# Patient Record
Sex: Male | Born: 1970 | Race: Black or African American | Hispanic: No | Smoking: Current every day smoker
Health system: Southern US, Community
[De-identification: ages and names within clinical notes are randomized; demographics above are authoritative.]

## PROBLEM LIST (undated history)

## (undated) DIAGNOSIS — S83249A Other tear of medial meniscus, current injury, unspecified knee, initial encounter: Secondary | ICD-10-CM

## (undated) DIAGNOSIS — Z720 Tobacco use: Secondary | ICD-10-CM

## (undated) DIAGNOSIS — F1910 Other psychoactive substance abuse, uncomplicated: Secondary | ICD-10-CM

## (undated) DIAGNOSIS — N390 Urinary tract infection, site not specified: Secondary | ICD-10-CM

## (undated) DIAGNOSIS — F17200 Nicotine dependence, unspecified, uncomplicated: Secondary | ICD-10-CM

## (undated) DIAGNOSIS — I10 Essential (primary) hypertension: Secondary | ICD-10-CM

## (undated) HISTORY — PX: BACK SURGERY: SHX140

---

## 1998-12-26 ENCOUNTER — Observation Stay (HOSPITAL_COMMUNITY): Admission: RE | Admit: 1998-12-26 | Discharge: 1998-12-26 | Payer: Self-pay | Admitting: Neurosurgery

## 1998-12-26 ENCOUNTER — Encounter: Payer: Self-pay | Admitting: Neurosurgery

## 2000-03-10 ENCOUNTER — Emergency Department (HOSPITAL_COMMUNITY): Admission: EM | Admit: 2000-03-10 | Discharge: 2000-03-10 | Payer: Self-pay | Admitting: Emergency Medicine

## 2001-02-16 ENCOUNTER — Encounter: Admission: RE | Admit: 2001-02-16 | Discharge: 2001-02-16 | Payer: Self-pay | Admitting: Internal Medicine

## 2001-02-16 ENCOUNTER — Encounter: Payer: Self-pay | Admitting: Internal Medicine

## 2002-09-16 ENCOUNTER — Encounter: Payer: Self-pay | Admitting: Emergency Medicine

## 2002-09-17 ENCOUNTER — Inpatient Hospital Stay (HOSPITAL_COMMUNITY): Admission: EM | Admit: 2002-09-17 | Discharge: 2002-09-18 | Payer: Self-pay | Admitting: Emergency Medicine

## 2002-09-17 ENCOUNTER — Encounter: Payer: Self-pay | Admitting: Internal Medicine

## 2002-09-17 ENCOUNTER — Encounter: Payer: Self-pay | Admitting: Cardiology

## 2002-10-14 ENCOUNTER — Emergency Department (HOSPITAL_COMMUNITY): Admission: EM | Admit: 2002-10-14 | Discharge: 2002-10-14 | Payer: Self-pay | Admitting: Emergency Medicine

## 2002-10-14 ENCOUNTER — Encounter: Payer: Self-pay | Admitting: Emergency Medicine

## 2002-10-15 ENCOUNTER — Emergency Department (HOSPITAL_COMMUNITY): Admission: EM | Admit: 2002-10-15 | Discharge: 2002-10-15 | Payer: Self-pay | Admitting: Emergency Medicine

## 2003-04-04 ENCOUNTER — Encounter: Payer: Self-pay | Admitting: Emergency Medicine

## 2003-04-04 ENCOUNTER — Emergency Department (HOSPITAL_COMMUNITY): Admission: EM | Admit: 2003-04-04 | Discharge: 2003-04-04 | Payer: Self-pay | Admitting: *Deleted

## 2003-11-11 ENCOUNTER — Emergency Department (HOSPITAL_COMMUNITY): Admission: EM | Admit: 2003-11-11 | Discharge: 2003-11-11 | Payer: Self-pay | Admitting: Emergency Medicine

## 2004-02-02 ENCOUNTER — Emergency Department (HOSPITAL_COMMUNITY): Admission: EM | Admit: 2004-02-02 | Discharge: 2004-02-02 | Payer: Self-pay | Admitting: Family Medicine

## 2004-11-14 ENCOUNTER — Emergency Department (HOSPITAL_COMMUNITY): Admission: EM | Admit: 2004-11-14 | Discharge: 2004-11-14 | Payer: Self-pay | Admitting: Family Medicine

## 2004-11-16 ENCOUNTER — Emergency Department (HOSPITAL_COMMUNITY): Admission: EM | Admit: 2004-11-16 | Discharge: 2004-11-16 | Payer: Self-pay | Admitting: Emergency Medicine

## 2005-01-26 ENCOUNTER — Emergency Department (HOSPITAL_COMMUNITY): Admission: EM | Admit: 2005-01-26 | Discharge: 2005-01-27 | Payer: Self-pay | Admitting: Emergency Medicine

## 2005-04-24 ENCOUNTER — Emergency Department (HOSPITAL_COMMUNITY): Admission: EM | Admit: 2005-04-24 | Discharge: 2005-04-24 | Payer: Self-pay | Admitting: Emergency Medicine

## 2005-05-11 ENCOUNTER — Emergency Department (HOSPITAL_COMMUNITY): Admission: EM | Admit: 2005-05-11 | Discharge: 2005-05-11 | Payer: Self-pay | Admitting: Emergency Medicine

## 2005-09-16 ENCOUNTER — Emergency Department (HOSPITAL_COMMUNITY): Admission: EM | Admit: 2005-09-16 | Discharge: 2005-09-17 | Payer: Self-pay | Admitting: Emergency Medicine

## 2005-09-23 ENCOUNTER — Emergency Department (HOSPITAL_COMMUNITY): Admission: EM | Admit: 2005-09-23 | Discharge: 2005-09-23 | Payer: Self-pay | Admitting: Emergency Medicine

## 2005-09-28 ENCOUNTER — Encounter: Admission: RE | Admit: 2005-09-28 | Discharge: 2005-09-28 | Payer: Self-pay | Admitting: Neurosurgery

## 2005-10-26 ENCOUNTER — Emergency Department (HOSPITAL_COMMUNITY): Admission: EM | Admit: 2005-10-26 | Discharge: 2005-10-26 | Payer: Self-pay | Admitting: Emergency Medicine

## 2005-11-24 ENCOUNTER — Emergency Department (HOSPITAL_COMMUNITY): Admission: EM | Admit: 2005-11-24 | Discharge: 2005-11-24 | Payer: Self-pay | Admitting: Emergency Medicine

## 2006-01-19 ENCOUNTER — Emergency Department (HOSPITAL_COMMUNITY): Admission: EM | Admit: 2006-01-19 | Discharge: 2006-01-19 | Payer: Self-pay | Admitting: Emergency Medicine

## 2006-02-04 ENCOUNTER — Emergency Department (HOSPITAL_COMMUNITY): Admission: EM | Admit: 2006-02-04 | Discharge: 2006-02-04 | Payer: Self-pay | Admitting: Family Medicine

## 2006-03-05 ENCOUNTER — Emergency Department (HOSPITAL_COMMUNITY): Admission: EM | Admit: 2006-03-05 | Discharge: 2006-03-05 | Payer: Self-pay | Admitting: Emergency Medicine

## 2006-03-31 ENCOUNTER — Emergency Department (HOSPITAL_COMMUNITY): Admission: EM | Admit: 2006-03-31 | Discharge: 2006-03-31 | Payer: Self-pay | Admitting: Emergency Medicine

## 2006-05-11 ENCOUNTER — Emergency Department (HOSPITAL_COMMUNITY): Admission: EM | Admit: 2006-05-11 | Discharge: 2006-05-11 | Payer: Self-pay | Admitting: Emergency Medicine

## 2006-06-08 ENCOUNTER — Emergency Department (HOSPITAL_COMMUNITY): Admission: EM | Admit: 2006-06-08 | Discharge: 2006-06-08 | Payer: Self-pay | Admitting: Emergency Medicine

## 2006-07-06 ENCOUNTER — Emergency Department (HOSPITAL_COMMUNITY): Admission: EM | Admit: 2006-07-06 | Discharge: 2006-07-06 | Payer: Self-pay | Admitting: Emergency Medicine

## 2006-08-11 ENCOUNTER — Emergency Department (HOSPITAL_COMMUNITY): Admission: EM | Admit: 2006-08-11 | Discharge: 2006-08-11 | Payer: Self-pay | Admitting: Emergency Medicine

## 2006-08-23 ENCOUNTER — Emergency Department (HOSPITAL_COMMUNITY): Admission: EM | Admit: 2006-08-23 | Discharge: 2006-08-24 | Payer: Self-pay | Admitting: Emergency Medicine

## 2006-09-05 ENCOUNTER — Encounter: Admission: RE | Admit: 2006-09-05 | Discharge: 2006-09-05 | Payer: Self-pay | Admitting: Neurosurgery

## 2006-11-02 ENCOUNTER — Emergency Department (HOSPITAL_COMMUNITY): Admission: EM | Admit: 2006-11-02 | Discharge: 2006-11-02 | Payer: Self-pay | Admitting: Emergency Medicine

## 2006-11-09 ENCOUNTER — Emergency Department (HOSPITAL_COMMUNITY): Admission: EM | Admit: 2006-11-09 | Discharge: 2006-11-09 | Payer: Self-pay | Admitting: Emergency Medicine

## 2006-12-07 ENCOUNTER — Emergency Department (HOSPITAL_COMMUNITY): Admission: EM | Admit: 2006-12-07 | Discharge: 2006-12-07 | Payer: Self-pay | Admitting: *Deleted

## 2006-12-21 ENCOUNTER — Emergency Department (HOSPITAL_COMMUNITY): Admission: EM | Admit: 2006-12-21 | Discharge: 2006-12-21 | Payer: Self-pay | Admitting: Emergency Medicine

## 2007-01-08 ENCOUNTER — Emergency Department (HOSPITAL_COMMUNITY): Admission: EM | Admit: 2007-01-08 | Discharge: 2007-01-09 | Payer: Self-pay | Admitting: Emergency Medicine

## 2007-01-18 ENCOUNTER — Emergency Department (HOSPITAL_COMMUNITY): Admission: EM | Admit: 2007-01-18 | Discharge: 2007-01-18 | Payer: Self-pay | Admitting: Emergency Medicine

## 2007-01-20 ENCOUNTER — Inpatient Hospital Stay (HOSPITAL_COMMUNITY): Admission: RE | Admit: 2007-01-20 | Discharge: 2007-01-22 | Payer: Self-pay | Admitting: Neurosurgery

## 2007-02-26 ENCOUNTER — Encounter: Admission: RE | Admit: 2007-02-26 | Discharge: 2007-02-26 | Payer: Self-pay | Admitting: Neurosurgery

## 2007-05-03 ENCOUNTER — Emergency Department (HOSPITAL_COMMUNITY): Admission: EM | Admit: 2007-05-03 | Discharge: 2007-05-03 | Payer: Self-pay | Admitting: Emergency Medicine

## 2007-05-10 ENCOUNTER — Emergency Department (HOSPITAL_COMMUNITY): Admission: EM | Admit: 2007-05-10 | Discharge: 2007-05-10 | Payer: Self-pay | Admitting: Emergency Medicine

## 2007-05-18 ENCOUNTER — Emergency Department (HOSPITAL_COMMUNITY): Admission: EM | Admit: 2007-05-18 | Discharge: 2007-05-19 | Payer: Self-pay | Admitting: Emergency Medicine

## 2007-06-07 ENCOUNTER — Emergency Department (HOSPITAL_COMMUNITY): Admission: EM | Admit: 2007-06-07 | Discharge: 2007-06-07 | Payer: Self-pay | Admitting: Emergency Medicine

## 2007-06-11 ENCOUNTER — Emergency Department (HOSPITAL_COMMUNITY): Admission: EM | Admit: 2007-06-11 | Discharge: 2007-06-11 | Payer: Self-pay | Admitting: Emergency Medicine

## 2007-06-13 ENCOUNTER — Encounter: Admission: RE | Admit: 2007-06-13 | Discharge: 2007-06-13 | Payer: Self-pay | Admitting: Neurosurgery

## 2007-06-14 ENCOUNTER — Emergency Department (HOSPITAL_COMMUNITY): Admission: EM | Admit: 2007-06-14 | Discharge: 2007-06-14 | Payer: Self-pay | Admitting: Emergency Medicine

## 2007-07-07 ENCOUNTER — Emergency Department (HOSPITAL_COMMUNITY): Admission: EM | Admit: 2007-07-07 | Discharge: 2007-07-07 | Payer: Self-pay | Admitting: *Deleted

## 2007-09-06 ENCOUNTER — Emergency Department (HOSPITAL_COMMUNITY): Admission: EM | Admit: 2007-09-06 | Discharge: 2007-09-06 | Payer: Self-pay | Admitting: *Deleted

## 2007-10-17 ENCOUNTER — Emergency Department (HOSPITAL_COMMUNITY): Admission: EM | Admit: 2007-10-17 | Discharge: 2007-10-17 | Payer: Self-pay | Admitting: Emergency Medicine

## 2008-02-22 IMAGING — CR DG CHEST 2V
3 series · 3 of 3 positions shown · non-contrast
Comparison: Report dated 10/08/2002

CLINICAL DATA: Spinal stenosis. History of tobacco use. Preoperative for lumbar
surgery.

CHEST - 2 VIEW

[view not recorded (1 of 3)]
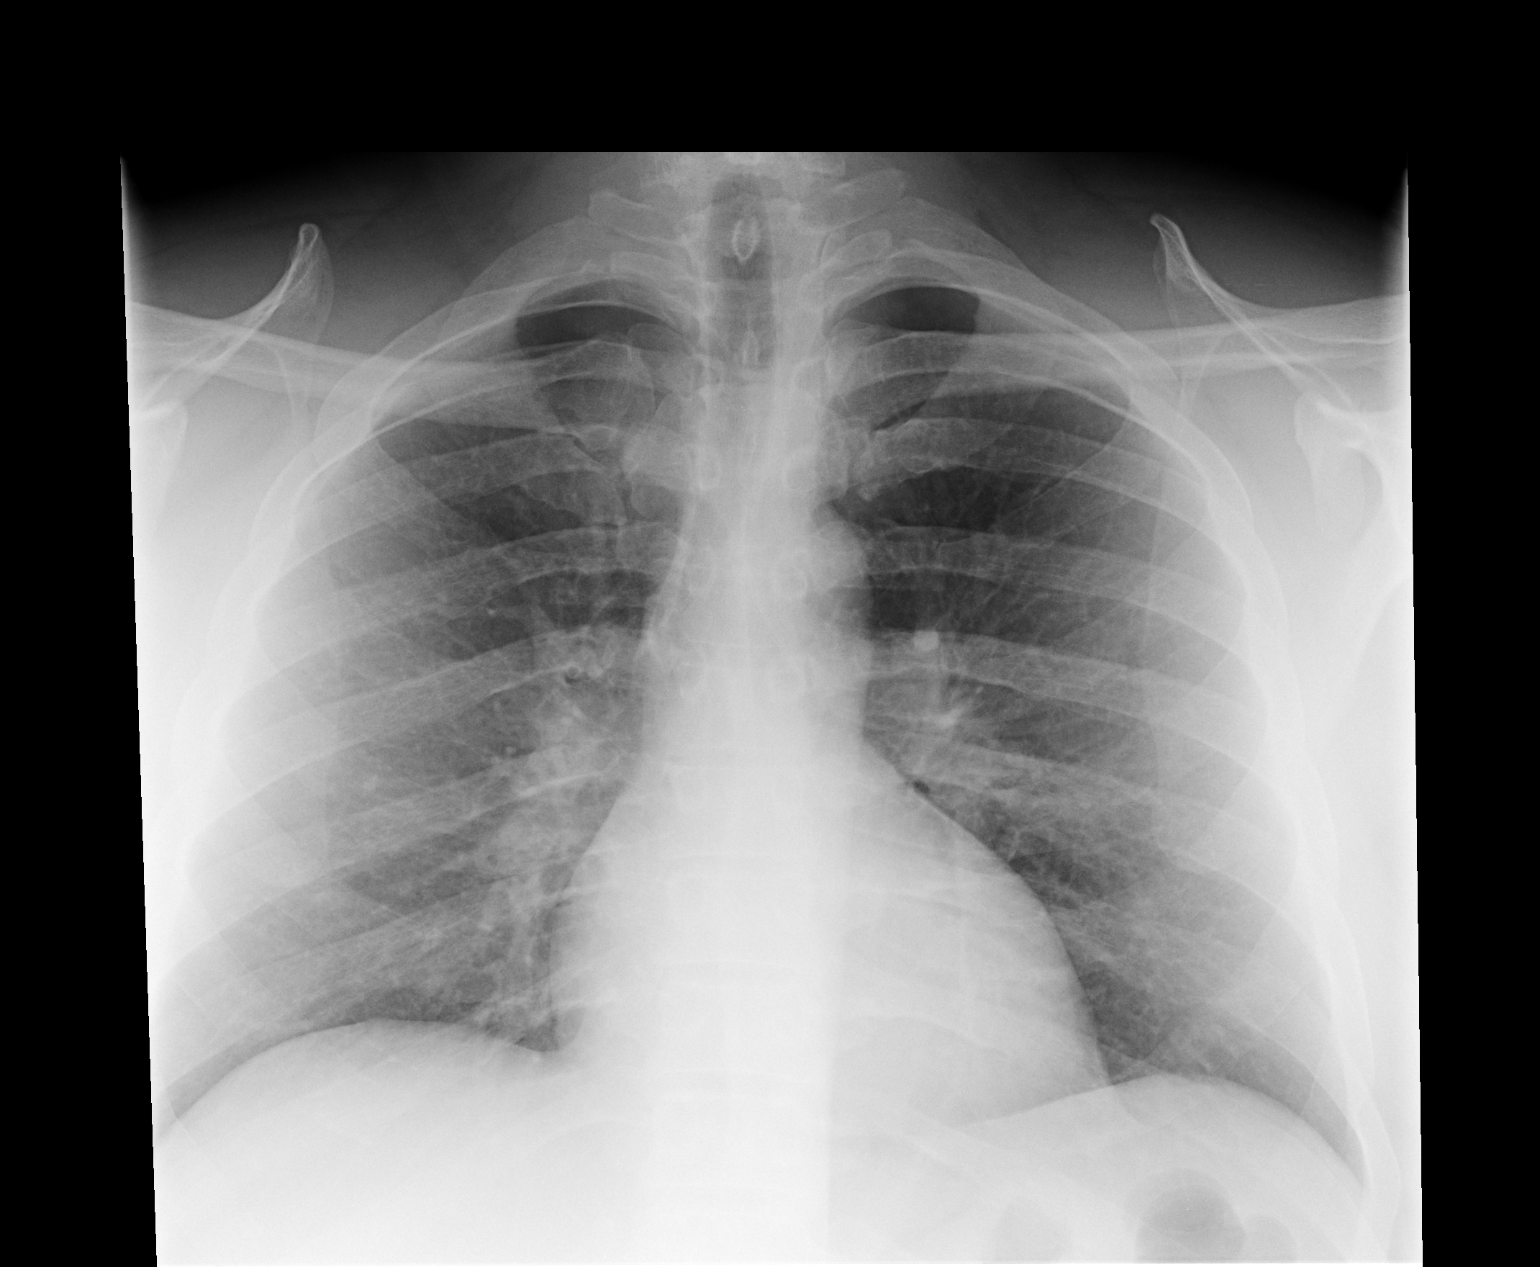

[view not recorded (2 of 3)]
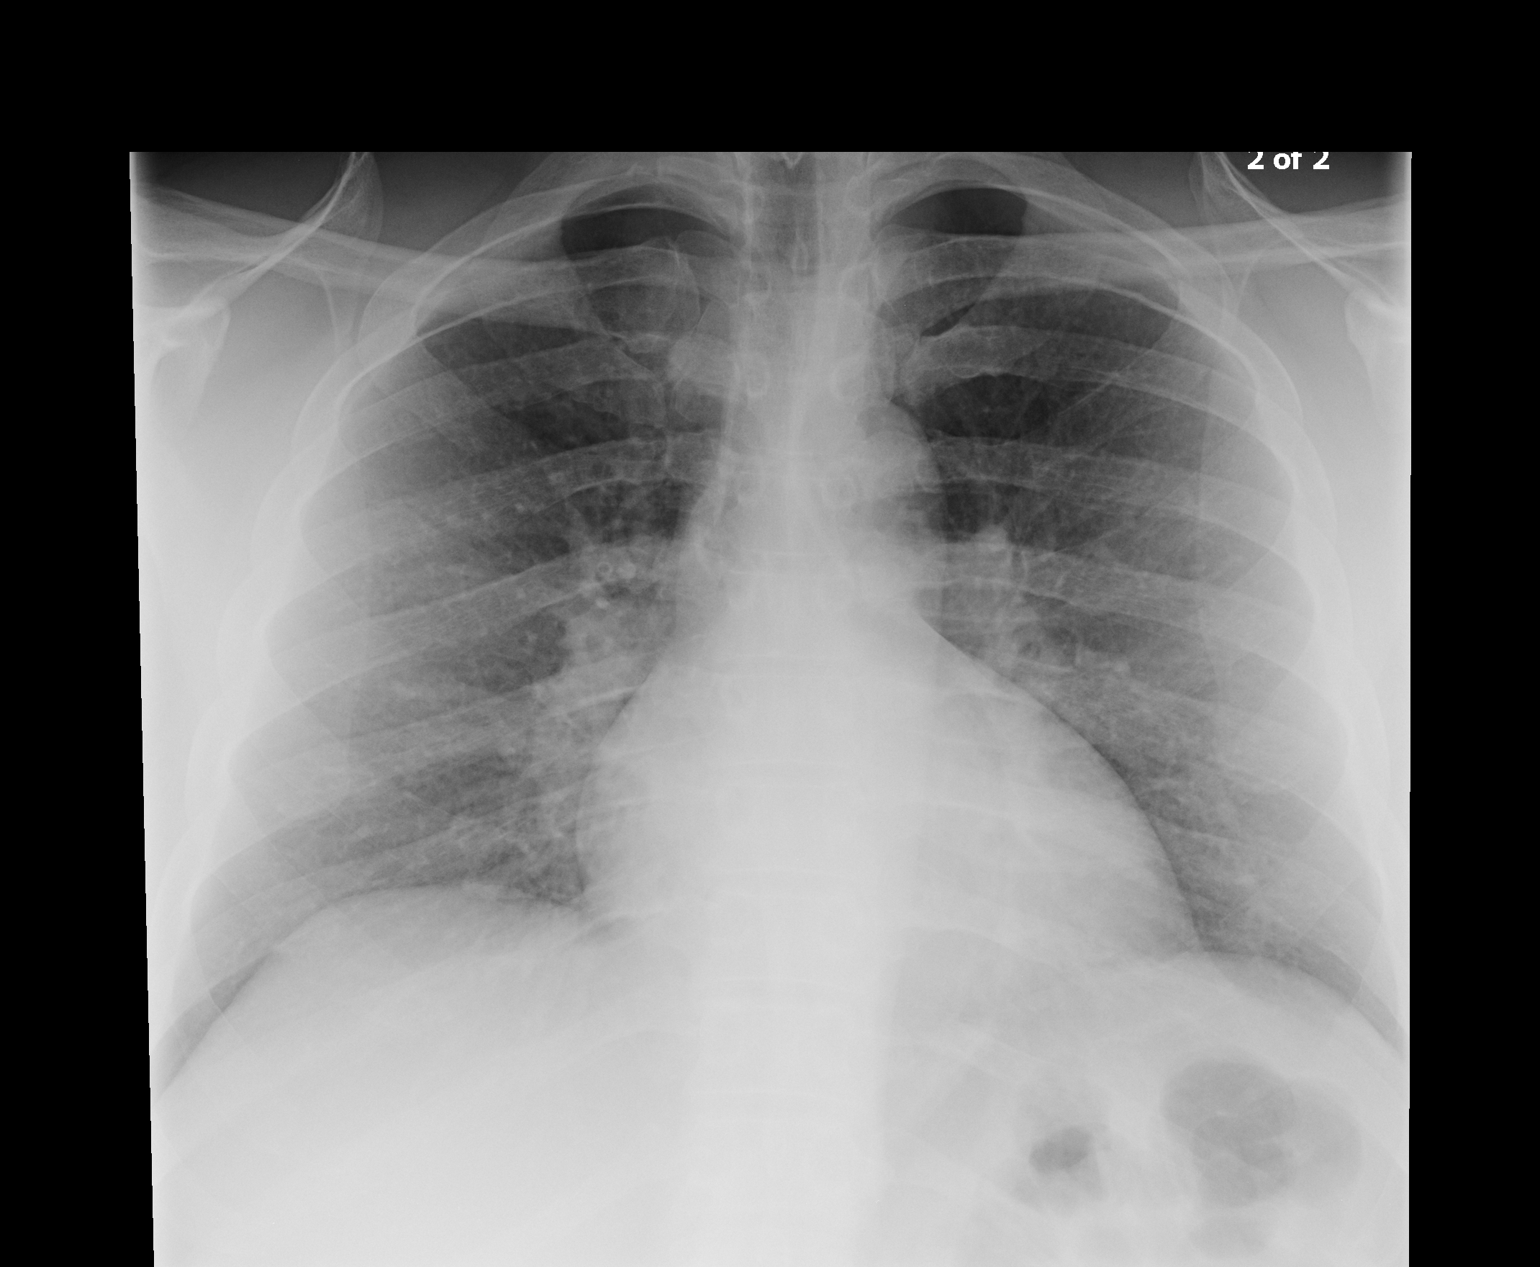

[view not recorded (3 of 3)]
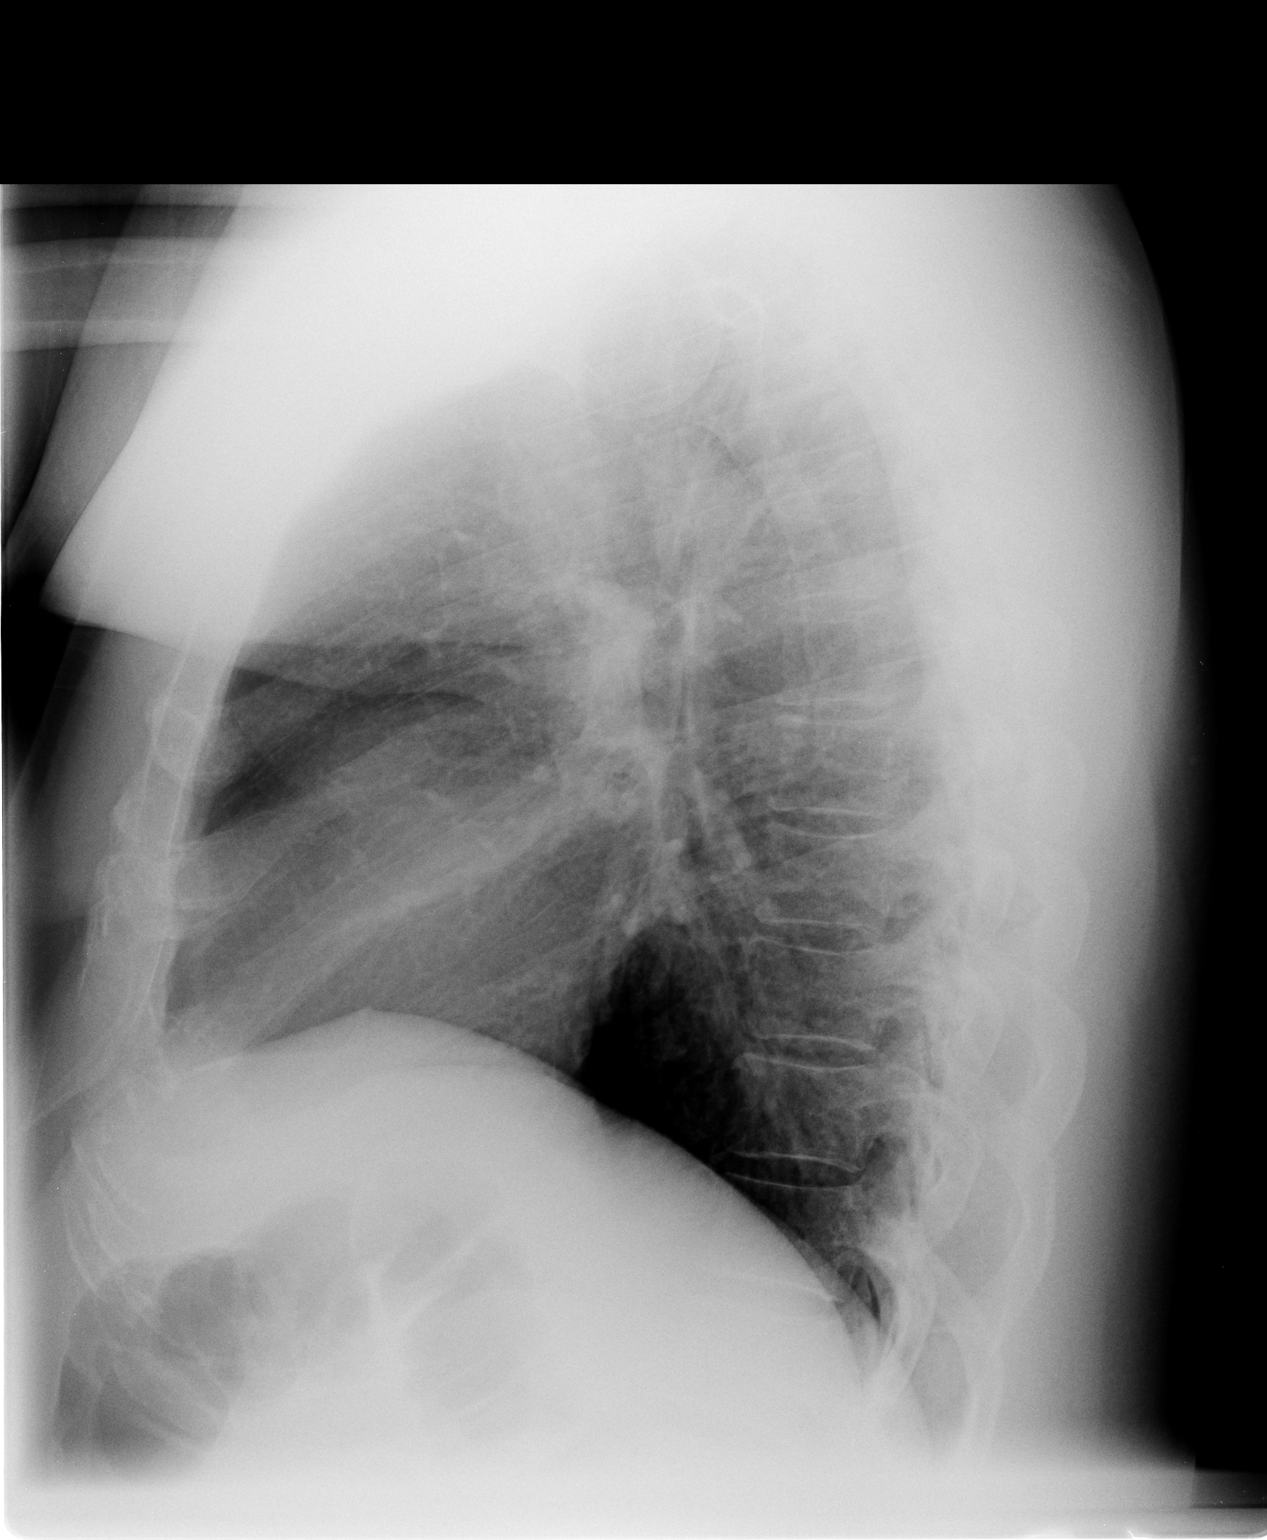

[3 of 3 positions shown; findings below may reference images not displayed]

FINDINGS: The lungs appear clear. No pleural effusion identified. The heart and
mediastinum appear unremarkable.

IMPRESSION

No significant abnormality is identified.

## 2008-04-23 ENCOUNTER — Emergency Department (HOSPITAL_COMMUNITY): Admission: EM | Admit: 2008-04-23 | Discharge: 2008-04-23 | Payer: Self-pay | Admitting: Emergency Medicine

## 2008-04-25 ENCOUNTER — Emergency Department (HOSPITAL_COMMUNITY): Admission: EM | Admit: 2008-04-25 | Discharge: 2008-04-25 | Payer: Self-pay | Admitting: Emergency Medicine

## 2011-01-01 NOTE — Op Note (Signed)
Eric Chang, SCHNECK                ACCOUNT NO.:  [id number]   MEDICAL RECORD NO.:  [id number]          PATIENT TYPE:  OIB   LOCATION:  3172                         FACILITY:  [facility]   PHYSICIAN:  Sherilyn Cooter A. Pool, M.D.    DATE OF BIRTH:  Jan 20, 1971   DATE OF PROCEDURE:  01/20/2007  DATE OF DISCHARGE:                               OPERATIVE REPORT   PREOPERATIVE DIAGNOSIS:  L4-5 unstable spondylolisthesis with stenosis.[name]   POSTOPERATIVE DIAGNOSIS:  L4-5 unstable spondylolisthesis with  stenosis.[name]   PROCEDURES:  1. L4-5 decompressive laminectomy with foraminotomies, more than would      be required for simple interbody fusion alone.  2. L4-5 posterior lumbar body fusion utilizing Tangent interbody      allograft wedge, Telamon interbody PEEK cage and local      autografting.  3. L4-5 posterolateral arthrodesis utilizing nonsegmental pedicle      screw fixation and local autografting.   SURGEON:  Kathaleen Maser. Pool, M.D.   Threasa HeadsYetta Barre.   ANESTHESIA:  General endotracheal.   INDICATIONS:  Mr. Amendola is a 40 year old male with a history of severe  back and bilateral lower extremity pain failing all conservative  management.  Workup demonstrates evidence of an unstable degenerative  spondylolisthesis at L4-5 with marked stenosis.  The patient counseled  as to options.  He decided proceed with L4-5 decompression and fusion in  hopes of improving his symptoms.   DESCRIPTION OF PROCEDURE:  The patient was taken to the operating room,  placed on the table in supine position.  After an adequate level of  anesthesia was achieved the patient was placed prone on the Wilson  frame, appropriately padded.  The patient's lumbar region was prepped  and draped sterilely.  It should be noted the patient is morbidly obese.  A linear skin incision was made overlying the L4-5 interspace.  This was  carried down sharply in __________  exposing the lamina of facet joints  of  L3, L4 and L5 as well  as transverse processes of L4 and L5.  Deep  self-retaining retractor was placed.  Intraoperative fluoroscopy was  used and the L4-5 level was confirmed.  The laminectomy was then  performed using Leksell rongeurs, Kerrison rongeurs, high-speed drill to  remove the entire lamina of L4, the entire inferior facet of L4  bilaterally and superior facet of L5 bilaterally and the superior aspect  of the L5 lamina.  All bone was cleaned and used in later autografting.  Ligamentum flavum was elevated and resected in piecemeal fashion using  Kerrison rongeurs on the thecal sac and exiting, L4-L5 nerve roots were  identified.  Epidural venous plexus coagulated and cut.  Starting first  on the left side.  Thecal sac and nerve roots mobilized and turned  towards midline.  Disk space then isolated and incised with a 15 blade  in rectangular fashion.  Wide disk space clean-out was achieved using  pituitary rongeurs and Epstein curettes.  All elements of the disk were  thoroughly resected.  The procedure then repeated on the contralateral  side.  Disk  space then sequentially dilated up to 12 mm with 12 mm  distractor left on the patient's left side.  Thecal sac and nerve roots  protected on the right side.  Disk space was then reamed and then cut  with 12 mm Tangent instruments.  Soft tissues removed from the  interspace.  A 12 x 26 mm PEEK cage packed with morselized autograft and  demineralized bone matrix was then packed into place recessed  approximately 2 mm from the posterior cortical margin.  The retractor  was removed from the patient's left side.  Thecal sac or nerve roots  protected on the left side.  Disk space was then once again reamed and  cut with 12 mm Tangent instrument.  Soft tissues removed from the  interspace.  Morselized autograft and DBM was then packed into the  interspace.  A 12 x 26 mm Tangent wedge was then packed into place  recessed approximately 2 mm from the posterior  margin.  Pedicles of L4-  L5 identified using superficial landmarks intraoperative fluoroscopy.  The superficial bone overlying the pedicle was then removed using high-  speed drill.  It should be noted the patient's bone was extremely hard  and the bone itself was very sclerotic.  Attempts to pass the pedicle  awl were unsuccessful manually and the pedicle awl itself had to be  hammered into the pedicle at L4-L5 bilaterally.  The pedicle awl tracts  were confirmed to be in good position.  The pedicle awl tracts were then  sequentially tapped with a 4.25, 5.25 and 6.25 mm screw tap.  Even with  this extraordinary dilation small cracks developed in the superficial  aspects of the pedicles of L4 bilaterally.  6.75 x 45 mm radius screws  were placed bilaterally.  They appeared to have solid purchase  particularly considering the bone was rock hard all the way into the  vertebral body.  A 6.75 x 40 mm screws placed bilaterally at L5.  Transverse processes were decorticated and morselized autograft packed  posterolaterally for later fusion.  Short segment titanium rods then  placed over the screw head.  Locking caps were then engaged with  construct under gentle compression.  Transverse connector was placed.  Gelfoam was placed topically for hemostasis.  Medium Hemovac drain left  in interspace.  Final images revealed good position.  __________ .  Wounds were inspected for hemostasis one final time and then closed in  typical fashion.  Steri-Strips and sterile dressings were applied.  There no complications.  He tolerated the procedure well and was  returned to the recovery room postoperatively.           ______________________________  Kathaleen Maser Pool, M.D.     HAP/MEDQ  D:  01/20/2007  T:  01/20/2007  Job:  [id number]

## 2011-01-01 NOTE — Op Note (Signed)
NAMEJARRIUS, Eric Chang                ACCOUNT NO.:  [id number]   MEDICAL RECORD NO.:  [id number]          PATIENT TYPE:  OIB   LOCATION:  3004                         FACILITY:  [facility]   PHYSICIAN:  Kathaleen Maser. Pool, M.D.    DATE OF BIRTH:  [birth year]   DATE OF PROCEDURE:  01/21/2007  DATE OF DISCHARGE:  01/18/2007                               OPERATIVE REPORT   PREOPERATIVE DIAGNOSIS:  Retained wound drain.   POSTOPERATIVE DIAGNOSIS:  Retained wound drain.   PROCEDURE NOTE:  Re-exploration of lumbar wound with removal of drainage  tube.   SURGEON:  Kathaleen Maser. Pool, M.D.   ANESTHESIA:  Local Marcaine.   INDICATIONS:  [name] is one day status post L4-L5 decompression,  fusion, and instrumentation.  The patient was being discharged today at  which point his Hemovac drain was attempted to be removed.  The drain,  itself, snapped into with a significant portion of the drain retained  within the patient.  I discussed the situation with [name].  I  recommended we re-explore the wound and remove the residual drain.  The  situation is once again complicated by his extremely large size.  The  patient is aware of the risks and benefits and wished to proceed.   OPERATIVE NOTE:  The patient was brought to the operating room and  placed on the operating table in a prone position on bolsters with  appropriate padding.  Local anesthesia was achieved with 0.25% Marcaine.  The superior aspect of the wound was then reopened.  The wound was  explored.  The Hemovac drain was identified and removed using hemostats.  The wound was then irrigated with antibiotic solution.  It was then  closed in layers with Vicryl sutures.  Steri-Strips and sterile dressing  were reapplied.  There were no apparent complications to this procedure.  He tolerated the procedure well and is returning to the floor  postoperatively.           ______________________________  Kathaleen Maser Pool, M.D.     HAP/MEDQ  D:   01/21/2007  T:  01/21/2007  Job:  [id number]

## 2011-01-04 NOTE — H&P (Signed)
   Eric Chang, Eric Chang                            ACCOUNT NO.:  [id number]   MEDICAL RECORD NO.:  [id number]                   PATIENT TYPE:  INP   LOCATION:  1308                                 FACILITY:  [facility]   PHYSICIAN:  Antony Madura, M.D.             DATE OF BIRTH:  10-27-1970   DATE OF ADMISSION:  09/16/2002  DATE OF DISCHARGE:                                HISTORY & PHYSICAL   HISTORY OF PRESENT ILLNESS:  The patient is a 40 year old obese black male  who is admitted because of a chief complaint of shortness of breath and  palpitations of two hours duration.  The patient was found in the ER to be  in atrial fibrillation, rate of 140.  He was started on a Cardizem drip at 3  mg/hr, and after several boluses, the rate had decreased to 100.  Admission  was felt necessary because of acute atrial fibrillation with rapid response.  The patient denies chest pain or prior cardiac history, or leg pain or  swelling.   ADMISSION MEDICATIONS:  None.   PAST MEDICAL HISTORY:  Noncontributory.   PHYSICAL EXAMINATION:  VITAL SIGNS:  Blood pressure 110/70, pulse 104,  atrial fibrillation, respirations 19, temperature 98.7.  SKIN:  Normal.  HEENT:  Normal.  NECK:  Normal.  No thyromegaly or bruit.  LUNGS:  Clear to auscultation and percussion.  HEART:  Atrial fibrillation, rate 104, no murmurs, rubs, or gallops.  ABDOMEN:  Soft.  EXTREMITIES:  Normal, no edema or pain.   IMPRESSION:  Atrial fibrillation, acute onset in a young 40 year old obese  male.  Rule out pulmonary embolus, rule out thyroid abnormality.   PLAN:  1. Rate control with Diltiazem, add digoxin, Coumadin.  2. CAT scan of the chest to rule out pulmonary embolus.  3. Thyroid studies.  4. A 2-D echocardiogram.  5. Cardiology consult.                                               Antony Madura, M.D.    RWR/MEDQ  D:  09/16/2002  T:  09/17/2002  Job:  [id number]

## 2011-01-04 NOTE — Discharge Summary (Signed)
   NAMEHUTCHINSON, Eric Chang                            ACCOUNT NO.:  [id number]   MEDICAL RECORD NO.:  [id number]                   PATIENT TYPE:  INP   LOCATION:  0369                                 FACILITY:  [facility]   PHYSICIAN:  Antony Madura, M.D.             DATE OF BIRTH:  02/20/1971   DATE OF ADMISSION:  09/16/2002  DATE OF DISCHARGE:  09/18/2002                                 DISCHARGE SUMMARY   FINAL DIAGNOSIS:  Atrial fibrillation, acute this admission.   HISTORY OF ILLNESS:  This 40 year old obese black male, is admitted because  of acute complaint of shortness of breath and palpitations of two hours  duration.  On arrival to the ER, the patient was in atrial fibrillation with  rapid ventricular response.  He was promptly started on a Cardizem drip with  reduction of his rate, the 100 range.  The patient was admitted.   HOSPITAL COURSE:  The patient was admitted to the telemetry floor, where by  the a.m. of admission, the patient had converted to normal sinus rhythm,  rate 70.  The patient was slowly tapered off Cardizem drip and placed on  digoxin.  He was also started on Coumadin.  Both of these will be regulated  as an outpatient.  Diagnostically, the patient received a 2 D echocardiogram  that showed left atrial size upper limits of normal, no valvular  dysfunction, and normal ejection fraction.  Thyroid functions were normal.  CAT scan of the lungs with contrast showed no evidence of pulmonary emboli.  The remainder of the hospital course was unremarkable, and discharge  planning was arranged with the preliminary diagnosis of acute atrial  fibrillation, etiology unknown, possibly lone fibrillator.   LABORATORY DATA:  WBC 11.4, hemoglobin 14.8, INR preliminary 0.9 increasing  to 1.1 after Coumadin nonstudy state.  Digoxin level 0.9 nonstudy state.  TSH 2.9, free T4 1.0.  CK-MB 8.7 with a relative index of 1.1, considered  negative for cardiac injury.  Troponin I was  normal.  Chest x-ray normal.   MEDICATIONS ON DISCHARGE:  1. Coumadin 5 mg daily to be followed up in 2 days after discharge.  2. Digoxin 0.25 daily.   ACTIVITY:  Normal.   DIET:  Regular.   FOLLOW UP:  In two days in my office for repeat digoxin level and INR.   CONDITION ON DISCHARGE:  Improved.                                               Antony Madura, M.D.    RWR/MEDQ  D:  09/18/2002  T:  09/18/2002  Job:  [id number]

## 2011-05-22 LAB — D-DIMER, QUANTITATIVE: D-Dimer, Quant: 0.29

## 2011-06-03 ENCOUNTER — Institutional Professional Consult (permissible substitution): Payer: Self-pay | Admitting: Pulmonary Disease

## 2011-06-06 LAB — ABO/RH: ABO/RH(D): B NEG

## 2011-06-06 LAB — TYPE AND SCREEN: ABO/RH(D): B NEG

## 2011-06-06 LAB — DIFFERENTIAL
Eosinophils Absolute: 0.1
Eosinophils Relative: 1
Lymphocytes Relative: 25
Lymphs Abs: 2.5
Monocytes Relative: 9
Neutro Abs: 6.3
Neutrophils Relative %: 64

## 2011-06-06 LAB — BASIC METABOLIC PANEL
BUN: 8
CO2: 27
GFR calc Af Amer: >60
Glucose, Bld: 99
Potassium: 4.1

## 2011-06-06 LAB — CBC
MCV: 89.7
Platelets: 341
RBC: 4.17 — ABNORMAL LOW
RDW: 13.9

## 2012-02-21 ENCOUNTER — Emergency Department (HOSPITAL_COMMUNITY)
Admission: EM | Admit: 2012-02-21 | Discharge: 2012-02-21 | Disposition: A | Discharge status: Home / Self Care | Payer: Medicaid Other | Attending: Emergency Medicine | Admitting: Emergency Medicine

## 2012-02-21 ENCOUNTER — Encounter (HOSPITAL_COMMUNITY): Payer: Self-pay | Admitting: *Deleted

## 2012-02-21 DIAGNOSIS — I10 Essential (primary) hypertension: Secondary | ICD-10-CM | POA: Insufficient documentation

## 2012-02-21 DIAGNOSIS — L03317 Cellulitis of buttock: Secondary | ICD-10-CM | POA: Insufficient documentation

## 2012-02-21 DIAGNOSIS — L0231 Cutaneous abscess of buttock: Secondary | ICD-10-CM | POA: Insufficient documentation

## 2012-02-21 DIAGNOSIS — F17200 Nicotine dependence, unspecified, uncomplicated: Secondary | ICD-10-CM | POA: Insufficient documentation

## 2012-02-21 HISTORY — DX: Essential (primary) hypertension: I10

## 2012-02-21 MED ORDER — CEPHALEXIN 500 MG PO CAPS: 500.0000 mg | ORAL_CAPSULE | Freq: Four times a day (QID) | ORAL | Status: AC

## 2012-02-21 MED ORDER — HYDROCODONE-ACETAMINOPHEN 5-325 MG PO TABS: 1.0000 | ORAL_TABLET | Freq: Four times a day (QID) | ORAL | Status: AC | PRN

## 2012-02-21 NOTE — ED Notes (Signed)
Abscess to right buttock noted a few weeks ago, becoming worse a week ago. Pt denies area currently draining.  Pt denies history of similar areas. Pt denies taking medication for pain.

## 2012-02-21 NOTE — ED Provider Notes (Signed)
History     CSN: [id number]  Arrival date & time 02/21/12  1047   First MD Initiated Contact with Patient 02/21/12 1115      Chief Complaint  Patient presents with  . Abscess    (Consider location/radiation/quality/duration/timing/severity/associated sxs/prior treatment) Patient is a 41 y.o. male presenting with abscess. The history is provided by the patient.  Abscess  This is a new problem. The current episode started more than one week ago. The abscess is present on the right buttock. The problem is moderate. The abscess is characterized by redness, painfulness, burning, draining and swelling. Pertinent negatives include no fever.  Pt states he had a small pimple that he popped about 2 wks ago. Since then area is more red, swollen, tender. States unable to sit on right side. No drainage. No medications taken. No other complaints.  Past Medical History  Diagnosis Date  . Hypertension     Past Surgical History  Procedure Date  . Back surgery     No family history on file.  History  Substance Use Topics  . Smoking status: Current Everyday Smoker  . Smokeless tobacco: Not on file  . Alcohol Use: Yes     rarely       Review of Systems  Constitutional: Negative for fever and chills.  Respiratory: Negative.   Cardiovascular: Negative.   Gastrointestinal: Negative.   Musculoskeletal: Negative.   Skin: Positive for color change and wound.    Allergies  Review of patient's allergies indicates no known allergies.  Home Medications  No current outpatient prescriptions on file.  BP 143/69  Pulse 86  Temp 98.7 F (37.1 C)  Resp 20  SpO2 100%  Physical Exam  Nursing note and vitals reviewed. Constitutional: He is oriented to person, place, and time. He appears well-developed and well-nourished. No distress.  Cardiovascular: Normal rate, regular rhythm and normal heart sounds.   Pulmonary/Chest: Effort normal and breath sounds normal. No respiratory distress. He  has no wheezes. He has no rales.  Neurological: He is alert and oriented to person, place, and time.  Skin: Skin is warm and dry.       5cm abscess to the left buttocks. Area erythemous, indurated, tender. No drainage.  Psychiatric: He has a normal mood and affect.    ED Course  Procedures (including critical care time)  INCISION AND DRAINAGE Performed by: Jaynie Crumble A Consent: Verbal consent obtained. Risks and benefits: risks, benefits and alternatives were discussed Type: abscess  Body area: left buttocks  Anesthesia: local infiltration  Local anesthetic: lidocaine 2% w/ epinephrine  Anesthetic total: 3 ml  Complexity: complex Blunt dissection to break up loculations  Drainage: purulent  Drainage amount: large  Packing material: 1/4 in iodoform gauze  Patient tolerance: Patient tolerated the procedure well with no immediate complications.   12:55 PM Abscess drained, packed. Will do antibiotics, pain management, return in 2 days for recheck. Pt afebrile,non toxic, no surrounding significant cellulitis.   1. Abscess of left buttock       MDM          Lottie Mussel, PA 02/21/12 1556

## 2012-02-22 NOTE — ED Provider Notes (Signed)
Medical screening examination/treatment/procedure(s) were performed by non-physician practitioner and as supervising physician I was immediately available for consultation/collaboration.  Christal Lagerstrom, MD 02/22/12 0759 

## 2012-02-23 ENCOUNTER — Emergency Department (HOSPITAL_COMMUNITY)
Admission: EM | Admit: 2012-02-23 | Discharge: 2012-02-23 | Disposition: A | Discharge status: Home / Self Care | Payer: Medicaid Other | Attending: Emergency Medicine | Admitting: Emergency Medicine

## 2012-02-23 DIAGNOSIS — Z4801 Encounter for change or removal of surgical wound dressing: Secondary | ICD-10-CM | POA: Insufficient documentation

## 2012-02-23 DIAGNOSIS — L0291 Cutaneous abscess, unspecified: Secondary | ICD-10-CM

## 2012-02-23 DIAGNOSIS — F17200 Nicotine dependence, unspecified, uncomplicated: Secondary | ICD-10-CM | POA: Insufficient documentation

## 2012-02-23 DIAGNOSIS — I10 Essential (primary) hypertension: Secondary | ICD-10-CM | POA: Insufficient documentation

## 2012-02-23 NOTE — ED Provider Notes (Signed)
History     CSN: [id number]  Arrival date & time 02/23/12  1156   First MD Initiated Contact with Patient 02/23/12 1211      Chief Complaint  Patient presents with  . Wound Check    (Consider location/radiation/quality/duration/timing/severity/associated sxs/prior treatment) HPI Comments: Patient presents today for a recheck of an abscess on his left buttock.  Incision and drainage was performed and abscess was packed two days ago.  Patient is currently taking a seven day course of Keflex qid.  Patient reports that the abscess is improving.  He denies any fever or chills.  Abscess is actively draining a small amount of purulent fluid.  Patient is a 41 y.o. male presenting with wound check. The history is provided by the patient.  Wound Check  The redness has improved. The swelling has improved. The pain has improved.    Past Medical History  Diagnosis Date  . Hypertension     Past Surgical History  Procedure Date  . Back surgery     No family history on file.  History  Substance Use Topics  . Smoking status: Current Everyday Smoker  . Smokeless tobacco: Not on file  . Alcohol Use: Yes     rarely       Review of Systems  Constitutional: Negative for fever and chills.  Gastrointestinal: Negative for nausea and vomiting.  Skin:       Abscess   All other systems reviewed and are negative.    Allergies  Review of patient's allergies indicates no known allergies.  Home Medications   Current Outpatient Rx  Name Route Sig Dispense Refill  . CEPHALEXIN 500 MG PO CAPS Oral Take 1 capsule (500 mg total) by mouth 4 (four) times daily. 28 capsule 0  . HYDROCODONE-ACETAMINOPHEN 5-325 MG PO TABS Oral Take 1 tablet by mouth every 6 (six) hours as needed for pain. 30 tablet 0    BP 114/72  Temp 98.5 F (36.9 C)  Resp 20  SpO2 99%  Physical Exam  Nursing note and vitals reviewed. Constitutional: He appears well-developed and well-nourished. No distress.  HENT:    Head: Normocephalic and atraumatic.  Mouth/Throat: Oropharynx is clear and moist.  Cardiovascular: Normal rate, regular rhythm and normal heart sounds.   Pulmonary/Chest: Effort normal and breath sounds normal.  Neurological: He is alert.  Skin: Skin is warm and dry. He is not diaphoretic.       ED Course  Procedures (including critical care time)  Labs Reviewed - No data to display No results found.   No diagnosis found.    MDM  Patient presenting with recheck for an abscess that was incised and drained two days ago.  Patient afebrile.  Abscess improving.   Patient instructed to continue antibiotics.  Return precautions discussed.        Pascal Lux [place], PA-C 02/23/12 2029

## 2012-02-24 NOTE — ED Provider Notes (Signed)
Medical screening examination/treatment/procedure(s) were performed by non-physician practitioner and as supervising physician I was immediately available for consultation/collaboration.  Bev Drennen K Linker, MD 02/24/12 1511 

## 2012-07-15 ENCOUNTER — Other Ambulatory Visit (HOSPITAL_COMMUNITY): Payer: Self-pay | Admitting: Internal Medicine

## 2012-07-15 ENCOUNTER — Ambulatory Visit (HOSPITAL_COMMUNITY)
Admission: RE | Admit: 2012-07-15 | Discharge: 2012-07-15 | Disposition: A | Discharge status: Home / Self Care | Payer: Medicaid Other | Source: Ambulatory Visit | Attending: Internal Medicine | Admitting: Internal Medicine

## 2012-07-15 DIAGNOSIS — M899 Disorder of bone, unspecified: Secondary | ICD-10-CM | POA: Insufficient documentation

## 2012-07-15 DIAGNOSIS — M79609 Pain in unspecified limb: Secondary | ICD-10-CM | POA: Insufficient documentation

## 2012-07-15 DIAGNOSIS — R52 Pain, unspecified: Secondary | ICD-10-CM

## 2012-07-15 DIAGNOSIS — M949 Disorder of cartilage, unspecified: Secondary | ICD-10-CM | POA: Insufficient documentation

## 2013-02-10 ENCOUNTER — Encounter (HOSPITAL_COMMUNITY): Payer: Self-pay | Admitting: Emergency Medicine

## 2013-02-10 ENCOUNTER — Emergency Department (HOSPITAL_COMMUNITY)
Admission: EM | Admit: 2013-02-10 | Discharge: 2013-02-10 | Discharge status: Against Medical Advice | Payer: Medicaid Other | Source: Home / Self Care

## 2013-02-10 ENCOUNTER — Emergency Department (HOSPITAL_COMMUNITY)
Admission: EM | Admit: 2013-02-10 | Discharge: 2013-02-11 | Disposition: A | Discharge status: Home / Self Care | Payer: Medicaid Other | Attending: Emergency Medicine | Admitting: Emergency Medicine

## 2013-02-10 DIAGNOSIS — Z79899 Other long term (current) drug therapy: Secondary | ICD-10-CM | POA: Insufficient documentation

## 2013-02-10 DIAGNOSIS — F17200 Nicotine dependence, unspecified, uncomplicated: Secondary | ICD-10-CM | POA: Insufficient documentation

## 2013-02-10 DIAGNOSIS — N390 Urinary tract infection, site not specified: Secondary | ICD-10-CM | POA: Insufficient documentation

## 2013-02-10 DIAGNOSIS — R109 Unspecified abdominal pain: Secondary | ICD-10-CM | POA: Insufficient documentation

## 2013-02-10 DIAGNOSIS — I10 Essential (primary) hypertension: Secondary | ICD-10-CM | POA: Insufficient documentation

## 2013-02-10 DIAGNOSIS — R509 Fever, unspecified: Secondary | ICD-10-CM | POA: Insufficient documentation

## 2013-02-10 DIAGNOSIS — R350 Frequency of micturition: Secondary | ICD-10-CM | POA: Insufficient documentation

## 2013-02-10 DIAGNOSIS — R300 Dysuria: Secondary | ICD-10-CM | POA: Insufficient documentation

## 2013-02-10 DIAGNOSIS — R3915 Urgency of urination: Secondary | ICD-10-CM | POA: Insufficient documentation

## 2013-02-10 LAB — POCT I-STAT, CHEM 8
BUN: 12 mg/dL (ref 6–23)
Calcium, Ion: 1.11 mmol/L — ABNORMAL LOW (ref 1.12–1.23)
Chloride: 99 mEq/L (ref 96–112)

## 2013-02-10 LAB — URINALYSIS, ROUTINE W REFLEX MICROSCOPIC
Hgb urine dipstick: NEGATIVE
Ketones, ur: NEGATIVE mg/dL
Protein, ur: 30 mg/dL — AB
Specific Gravity, Urine: 1.028 (ref 1.005–1.030)
Urobilinogen, UA: 1 mg/dL (ref 0.0–1.0)

## 2013-02-10 LAB — URINE MICROSCOPIC-ADD ON

## 2013-02-10 MED ORDER — FENTANYL CITRATE 0.05 MG/ML IJ SOLN
50.0000 ug | Freq: Once | INTRAMUSCULAR | Status: AC
  Administered 2013-02-10: 50 ug via INTRAVENOUS
  Filled 2013-02-10: qty 2

## 2013-02-10 MED ORDER — ACETAMINOPHEN 325 MG PO TABS
650.0000 mg | ORAL_TABLET | Freq: Once | ORAL | Status: AC
  Administered 2013-02-10: 650 mg via ORAL
  Filled 2013-02-10: qty 2

## 2013-02-10 MED ORDER — DEXTROSE 5 % IV SOLN
1.0000 g | Freq: Once | INTRAVENOUS | Status: AC
  Administered 2013-02-10: 1 g via INTRAVENOUS
  Filled 2013-02-10: qty 10

## 2013-02-10 MED ORDER — SODIUM CHLORIDE 0.9 % IV BOLUS (SEPSIS)
1000.0000 mL | Freq: Once | INTRAVENOUS | Status: AC
Start: 2013-02-10 — End: 2013-02-11
  Administered 2013-02-10: 1000 mL via INTRAVENOUS

## 2013-02-10 NOTE — ED Notes (Signed)
Called pt to treatment room again. No response

## 2013-02-10 NOTE — ED Notes (Signed)
Pt states that he has been having frequency and dysuria x 1 day.  C/o pelvic pressure.

## 2013-02-10 NOTE — ED Notes (Signed)
Called patient to treatment room. No response

## 2013-02-10 NOTE — ED Notes (Signed)
Pt states that he has been feeling like he needs to urinate frequently but has been unable to void any urine. Pt states that the pressure in his mid lower abdomen has been getting worse.

## 2013-02-10 NOTE — ED Provider Notes (Signed)
History    CSN: [id number] Arrival date & time 02/10/13  2101  First MD Initiated Contact with Patient 02/10/13 2313     Chief Complaint  Patient presents with  . Dysuria   (Consider location/radiation/quality/duration/timing/severity/associated sxs/prior Treatment) HPI Hx per PT Onset last night dysuria, urgency and frequency ongoing today. No h/o same, no known h/o prostate issues.  He had back surgery in 2008 and doing well since then. Every once in awhile takes pain medications for LBP.  He lays brick for a living. No flank pain. Has felt feverish tonight. LGF noted in triage. No N/V. Has some intermittent lower ABD pressure. No urinary retention. No testicle pain or discharge.   Past Medical History  Diagnosis Date  . Hypertension    Past Surgical History  Procedure Laterality Date  . Back surgery     No family history on file. History  Substance Use Topics  . Smoking status: Current Every Day Smoker -- 1.00 packs/day for 30 years    Types: Cigarettes  . Smokeless tobacco: Former Neurosurgeon  . Alcohol Use: No    Review of Systems  Constitutional: Positive for fever.  HENT: Negative for neck pain and neck stiffness.   Eyes: Negative for pain.  Respiratory: Negative for shortness of breath.   Cardiovascular: Negative for chest pain.  Gastrointestinal: Negative for abdominal pain.  Genitourinary: Positive for dysuria, urgency and frequency.  Musculoskeletal: Negative for back pain.  Skin: Negative for rash.  Neurological: Negative for headaches.  All other systems reviewed and are negative.    Allergies  Review of patient's allergies indicates no known allergies.  Home Medications   Current Outpatient Rx  Name  Route  Sig  Dispense  Refill  . atenolol (TENORMIN) 100 MG tablet   Oral   Take 100 mg by mouth daily.         [name] lisinopril (PRINIVIL,ZESTRIL) 20 MG tablet   Oral   Take 20 mg by mouth daily.          BP 137/76  Pulse 116  Temp(Src) 100.3 F  (37.9 C) (Oral)  Resp 16  Ht 6\' 3"  (1.905 m)  Wt 326 lb (147.873 kg)  BMI 40.75 kg/m2  SpO2 95% Physical Exam  Constitutional: He is oriented to person, place, and time. He appears well-developed and well-nourished.  HENT:  Head: Normocephalic and atraumatic.  Eyes: EOM are normal. Pupils are equal, round, and reactive to light.  Neck: Neck supple.  Cardiovascular: Normal rate, regular rhythm and intact distal pulses.   Pulmonary/Chest: Effort normal and breath sounds normal. No respiratory distress.  Abdominal: Soft. Bowel sounds are normal. He exhibits no distension. There is no tenderness. There is no rebound and no guarding.  Musculoskeletal: Normal range of motion. He exhibits no edema.  Neurological: He is alert and oriented to person, place, and time.  Skin: Skin is warm and dry.    ED Course  Procedures (including critical care time)  Results for orders placed during the hospital encounter of 02/10/13  URINALYSIS, ROUTINE W REFLEX MICROSCOPIC      Result Value Range   Color, Urine ORANGE (*) YELLOW   APPearance CLOUDY (*) CLEAR   Specific Gravity, Urine 1.028  1.005 - 1.030   pH 5.5  5.0 - 8.0   Glucose, UA NEGATIVE  NEGATIVE mg/dL   Hgb urine dipstick NEGATIVE  NEGATIVE   Bilirubin Urine SMALL (*) NEGATIVE   Ketones, ur NEGATIVE  NEGATIVE mg/dL   Protein, ur 30 (*)  NEGATIVE mg/dL   Urobilinogen, UA 1.0  0.0 - 1.0 mg/dL   Nitrite POSITIVE (*) NEGATIVE   Leukocytes, UA SMALL (*) NEGATIVE  URINE MICROSCOPIC-ADD ON      Result Value Range   Squamous Epithelial / LPF RARE  RARE   WBC, UA 21-50  <3 WBC/hpf   RBC / HPF 0-2  <3 RBC/hpf   Bacteria, UA FEW (*) RARE   Casts HYALINE CASTS (*) NEGATIVE   Urine-Other MUCOUS PRESENT    POCT I-STAT, CHEM 8      Result Value Range   Sodium 136  135 - 145 mEq/L   Potassium 3.7  3.5 - 5.1 mEq/L   Chloride 99  96 - 112 mEq/L   BUN 12  6 - 23 mg/dL   Creatinine, Ser 1.61  0.50 - 1.35 mg/dL   Glucose, Bld 096 (*) 70 - 99  mg/dL   Calcium, Ion 0.45 (*) 1.12 - 1.23 mmol/L   TCO2 25  0 - 100 mmol/L   Hemoglobin 14.6  13.0 - 17.0 g/dL   HCT 40.9  81.1 - 91.4 %   IV Rocephin U Cx pending  IVFs and pain medications  1:08 AM recheck - symptoms improving, HR normalized. Plan d/c home ABx and referral to Urology. UTI precautions provided/ verbalized as understood.   MDM   UTI symptoms with low grade fever  Labs and UA as above   IVFs and ABx provided  VS and nurses notes reviewed  Sunnie Nielsen, MD 02/11/13 [phone]

## 2013-02-10 NOTE — ED Notes (Signed)
[place], MD at bedside. 

## 2013-02-10 NOTE — ED Notes (Signed)
Called x1. No answer.

## 2013-02-11 MED ORDER — CEFPODOXIME PROXETIL 100 MG PO TABS: 100.0000 mg | ORAL_TABLET | Freq: Two times a day (BID) | ORAL | Status: DC

## 2013-02-12 LAB — URINE CULTURE

## 2013-02-13 ENCOUNTER — Telehealth (HOSPITAL_COMMUNITY): Payer: Self-pay | Admitting: Emergency Medicine

## 2013-02-13 NOTE — ED Notes (Signed)
Post ED Visit - Positive Culture Follow-up  Culture report reviewed by antimicrobial stewardship pharmacist: []  Wes Dulaney, Pharm.D., BCPS []  Celedonio Miyamoto, Pharm.D., BCPS []  Georgina Pillion, Pharm.D., BCPS []  [place], [address].D., BCPS, AAHIVP []  Estella Husk, Pharm.D., BCPS, AAHIVP [x]  Laurence Slate, Pharm.D., BCPS  Positive urine culture Treated with Cefpodoxime, organism sensitive to the same and no further patient follow-up is required at this time.  [name] 02/13/2013, 3:31 PM

## 2013-03-10 ENCOUNTER — Emergency Department (HOSPITAL_COMMUNITY)
Admission: EM | Admit: 2013-03-10 | Discharge: 2013-03-10 | Disposition: A | Discharge status: Home / Self Care | Payer: Medicaid Other | Attending: Emergency Medicine | Admitting: Emergency Medicine

## 2013-03-10 ENCOUNTER — Encounter (HOSPITAL_COMMUNITY): Payer: Self-pay | Admitting: Emergency Medicine

## 2013-03-10 DIAGNOSIS — N390 Urinary tract infection, site not specified: Secondary | ICD-10-CM

## 2013-03-10 DIAGNOSIS — F17200 Nicotine dependence, unspecified, uncomplicated: Secondary | ICD-10-CM | POA: Insufficient documentation

## 2013-03-10 DIAGNOSIS — Z79899 Other long term (current) drug therapy: Secondary | ICD-10-CM | POA: Insufficient documentation

## 2013-03-10 DIAGNOSIS — R319 Hematuria, unspecified: Secondary | ICD-10-CM | POA: Insufficient documentation

## 2013-03-10 DIAGNOSIS — I10 Essential (primary) hypertension: Secondary | ICD-10-CM | POA: Insufficient documentation

## 2013-03-10 LAB — URINALYSIS, ROUTINE W REFLEX MICROSCOPIC
Glucose, UA: NEGATIVE mg/dL
Ketones, ur: NEGATIVE mg/dL
pH: 5.5 (ref 5.0–8.0)

## 2013-03-10 LAB — URINE MICROSCOPIC-ADD ON

## 2013-03-10 MED ORDER — PHENAZOPYRIDINE HCL 200 MG PO TABS: 200.0000 mg | ORAL_TABLET | Freq: Three times a day (TID) | ORAL | Status: DC

## 2013-03-10 MED ORDER — SULFAMETHOXAZOLE-TRIMETHOPRIM 800-160 MG PO TABS: 1.0000 | ORAL_TABLET | Freq: Two times a day (BID) | ORAL | Status: DC

## 2013-03-10 NOTE — ED Provider Notes (Signed)
History    CSN: [id number] Arrival date & time 03/10/13  1609  First MD Initiated Contact with Patient 03/10/13 1733     Chief Complaint  Patient presents with  . Dysuria   (Consider location/radiation/quality/duration/timing/severity/associated sxs/prior Treatment) Patient is a 42 y.o. male presenting with dysuria. The history is provided by the patient. No language interpreter was used.  Dysuria This is a recurrent problem. Pertinent negatives include no abdominal pain, chills, fever, nausea or vomiting. Associated symptoms comments: Recurrent symptoms of dysuria, some hematuria, difficulty starting stream of urination. He denies scrotal swelling or testicular pain. No fever, N, V. He had similar symptoms one month ago that went away with treatment. .   Past Medical History  Diagnosis Date  . Hypertension    Past Surgical History  Procedure Laterality Date  . Back surgery     No family history on file. History  Substance Use Topics  . Smoking status: Current Every Day Smoker -- 1.00 packs/day for 30 years    Types: Cigarettes  . Smokeless tobacco: Former Neurosurgeon  . Alcohol Use: No    Review of Systems  Constitutional: Negative for fever and chills.  Gastrointestinal: Negative.  Negative for nausea, vomiting and abdominal pain.  Genitourinary: Positive for dysuria. Negative for discharge, scrotal swelling and testicular pain.  Musculoskeletal: Negative.     Allergies  Review of patient's allergies indicates no known allergies.  Home Medications   Current Outpatient Rx  Name  Route  Sig  Dispense  Refill  . atenolol (TENORMIN) 100 MG tablet   Oral   Take 100 mg by mouth daily.         . carisoprodol (SOMA) 350 MG tablet   Oral   Take 350 mg by mouth 4 (four) times daily as needed for muscle spasms.         [name] lisinopril (PRINIVIL,ZESTRIL) 20 MG tablet   Oral   Take 20 mg by mouth daily.         [name] oxyCODONE (OXYCONTIN) 20 MG 12 hr tablet   Oral   Take  20 mg by mouth every 12 (twelve) hours as needed for pain.         . phenazopyridine (PYRIDIUM) 200 MG tablet   Oral   Take 1 tablet (200 mg total) by mouth 3 (three) times daily.   6 tablet   0   . sulfamethoxazole-trimethoprim (SEPTRA DS) 800-160 MG per tablet   Oral   Take 1 tablet by mouth every 12 (twelve) hours.   28 tablet   0    BP 135/72  Pulse 89  Temp(Src) 98.4 F (36.9 C) (Oral)  Resp 18  SpO2 100% Physical Exam  Constitutional: He appears well-developed and well-nourished. No distress.  Pulmonary/Chest: Effort normal.  Abdominal: Soft. There is no tenderness. There is no rebound and no guarding.    ED Course  Procedures (including critical care time) Labs Reviewed  URINALYSIS, ROUTINE W REFLEX MICROSCOPIC - Abnormal; Notable for the following:    Color, Urine AMBER (*)    APPearance TURBID (*)    Specific Gravity, Urine 1.031 (*)    Hgb urine dipstick LARGE (*)    Bilirubin Urine SMALL (*)    Protein, ur 100 (*)    Nitrite POSITIVE (*)    Leukocytes, UA LARGE (*)    All other components within normal limits  URINE MICROSCOPIC-ADD ON - Abnormal; Notable for the following:    Bacteria, UA MANY (*)  Casts HYALINE CASTS (*)    All other components within normal limits  URINE CULTURE   No results found. 1. UTI (lower urinary tract infection)     MDM  Suspect prostatitis with difficulty starting stream and recurrent symptoms, however, patient defers prostate exam until urology follow up. Will treat with abx. Refer to Derma.  Arnoldo Hooker, PA-C 03/10/13 [phone]

## 2013-03-10 NOTE — ED Notes (Signed)
Pt states that he has been having frequency of urination and dysuria x 1 day.  Has had UTI before.  States "this is not an STD".

## 2013-03-11 NOTE — ED Provider Notes (Signed)
Medical screening examination/treatment/procedure(s) were conducted as a shared visit with non-physician practitioner(s) or resident  and myself.  I personally evaluated the patient during the encounter and agree with the findings and plan unless otherwise indicated.   Enid Skeens, MD 03/11/13 [phone]

## 2013-03-12 LAB — URINE CULTURE

## 2013-03-14 ENCOUNTER — Telehealth (HOSPITAL_COMMUNITY): Payer: Self-pay | Admitting: Emergency Medicine

## 2013-03-14 NOTE — ED Notes (Signed)
Post ED Visit - Positive Culture Follow-up  Culture report reviewed by antimicrobial stewardship pharmacist: []  Wes Dulaney, Pharm.D., BCPS [x]  Celedonio Miyamoto, Pharm.D., BCPS []  Georgina Pillion, [address].D., BCPS []  [place], [address].D., BCPS, AAHIVP []  Estella Husk, Pharm.D., BCPS, AAHIVP  Positive urine culture Treated with Sulfa-Trimeth, organism sensitive to the same and no further patient follow-up is required at this time.  [name] 03/14/2013, 9:40 AM

## 2013-06-06 ENCOUNTER — Encounter (HOSPITAL_COMMUNITY): Payer: Self-pay | Admitting: Emergency Medicine

## 2013-06-06 ENCOUNTER — Emergency Department (HOSPITAL_COMMUNITY)
Admission: EM | Admit: 2013-06-06 | Discharge: 2013-06-07 | Disposition: A | Discharge status: Home / Self Care | Payer: Medicaid Other | Attending: Emergency Medicine | Admitting: Emergency Medicine

## 2013-06-06 DIAGNOSIS — Z87440 Personal history of urinary (tract) infections: Secondary | ICD-10-CM | POA: Insufficient documentation

## 2013-06-06 DIAGNOSIS — I10 Essential (primary) hypertension: Secondary | ICD-10-CM | POA: Insufficient documentation

## 2013-06-06 DIAGNOSIS — N12 Tubulo-interstitial nephritis, not specified as acute or chronic: Secondary | ICD-10-CM

## 2013-06-06 DIAGNOSIS — F17200 Nicotine dependence, unspecified, uncomplicated: Secondary | ICD-10-CM | POA: Insufficient documentation

## 2013-06-06 DIAGNOSIS — N390 Urinary tract infection, site not specified: Secondary | ICD-10-CM | POA: Insufficient documentation

## 2013-06-06 DIAGNOSIS — R319 Hematuria, unspecified: Secondary | ICD-10-CM | POA: Insufficient documentation

## 2013-06-06 DIAGNOSIS — Z79899 Other long term (current) drug therapy: Secondary | ICD-10-CM | POA: Insufficient documentation

## 2013-06-06 HISTORY — DX: Urinary tract infection, site not specified: N39.0

## 2013-06-06 MED ORDER — SODIUM CHLORIDE 0.9 % IV BOLUS (SEPSIS)
1000.0000 mL | Freq: Once | INTRAVENOUS | Status: AC
  Administered 2013-06-07: 1000 mL via INTRAVENOUS

## 2013-06-06 MED ORDER — ONDANSETRON HCL 4 MG/2ML IJ SOLN
4.0000 mg | Freq: Once | INTRAMUSCULAR | Status: AC
  Administered 2013-06-07: 4 mg via INTRAVENOUS
  Filled 2013-06-06: qty 2

## 2013-06-06 MED ORDER — MORPHINE SULFATE 4 MG/ML IJ SOLN
4.0000 mg | Freq: Once | INTRAMUSCULAR | Status: AC
  Administered 2013-06-07: 4 mg via INTRAVENOUS
  Filled 2013-06-06: qty 1

## 2013-06-06 NOTE — ED Notes (Signed)
Per [facility] Pt dx with UTI 2 months ago.  Pt now c/o blood in urine and symptoms of UTI also rt flank pain. Denies N/V/D and fever Pt has HX of HTN BP 200/110 states compliant with meds

## 2013-06-07 ENCOUNTER — Encounter (HOSPITAL_COMMUNITY): Payer: Self-pay | Admitting: Emergency Medicine

## 2013-06-07 LAB — URINALYSIS, ROUTINE W REFLEX MICROSCOPIC
Nitrite: POSITIVE — AB
Specific Gravity, Urine: 1.021 (ref 1.005–1.030)
Urobilinogen, UA: 1 mg/dL (ref 0.0–1.0)

## 2013-06-07 LAB — CBC WITH DIFFERENTIAL/PLATELET
Eosinophils Absolute: 0.1 10*3/uL (ref 0.0–0.7)
Hemoglobin: 13.3 g/dL (ref 13.0–17.0)
Lymphocytes Relative: 13 % (ref 12–46)
Lymphs Abs: 1.8 10*3/uL (ref 0.7–4.0)
MCH: 28.3 pg (ref 26.0–34.0)
MCV: 85.7 fL (ref 78.0–100.0)
Monocytes Relative: 10 % (ref 3–12)
Neutrophils Relative %: 76 % (ref 43–77)
RBC: 4.7 MIL/uL (ref 4.22–5.81)

## 2013-06-07 LAB — URINE MICROSCOPIC-ADD ON

## 2013-06-07 LAB — BASIC METABOLIC PANEL
BUN: 8 mg/dL (ref 6–23)
CO2: 25 mEq/L (ref 19–32)
GFR calc non Af Amer: 85 mL/min — ABNORMAL LOW (ref >90)
Glucose, Bld: 109 mg/dL — ABNORMAL HIGH (ref 70–99)
Potassium: 3.6 mEq/L (ref 3.5–5.1)

## 2013-06-07 MED ORDER — CEPHALEXIN 500 MG PO CAPS: 500.0000 mg | ORAL_CAPSULE | Freq: Four times a day (QID) | ORAL | Status: DC

## 2013-06-07 MED ORDER — HYDROMORPHONE HCL PF 1 MG/ML IJ SOLN
1.0000 mg | Freq: Once | INTRAMUSCULAR | Status: AC
  Administered 2013-06-07: 1 mg via INTRAVENOUS
  Filled 2013-06-07: qty 1

## 2013-06-07 MED ORDER — ONDANSETRON HCL 4 MG PO TABS: 4.0000 mg | ORAL_TABLET | Freq: Four times a day (QID) | ORAL | Status: DC

## 2013-06-07 MED ORDER — OXYCODONE-ACETAMINOPHEN 5-325 MG PO TABS: 1.0000 | ORAL_TABLET | ORAL | Status: DC | PRN

## 2013-06-07 MED ORDER — KETOROLAC TROMETHAMINE 30 MG/ML IJ SOLN
30.0000 mg | Freq: Once | INTRAMUSCULAR | Status: AC
  Administered 2013-06-07: 30 mg via INTRAVENOUS
  Filled 2013-06-07: qty 1

## 2013-06-07 MED ORDER — DEXTROSE 5 % IV SOLN
1.0000 g | Freq: Once | INTRAVENOUS | Status: AC
  Administered 2013-06-07: 1 g via INTRAVENOUS
  Filled 2013-06-07: qty 10

## 2013-06-07 NOTE — ED Provider Notes (Signed)
Medical screening examination/treatment/procedure(s) were performed by non-physician practitioner and as supervising physician I was immediately available for consultation/collaboration.   Loren Racer, MD 06/07/13 [phone]

## 2013-06-07 NOTE — ED Provider Notes (Signed)
He is feeling better with fluids and medications. Abx given IV. He appears stable for discharge and states he is ready to go home.   Arnoldo Hooker, PA-C 06/07/13 [phone]

## 2013-06-07 NOTE — ED Provider Notes (Signed)
CSN: [id number]     Arrival date & time 06/06/13  2236 History   First MD Initiated Contact with Patient 06/06/13 2248     Chief Complaint  Patient presents with  . Hematuria  . Urinary Tract Infection  . Flank Pain  . Hypertension   (Consider location/radiation/quality/duration/timing/severity/associated sxs/prior Treatment) HPI Comments: Patient is a 42 year old male with a past medical history of hypertension who presents with a 2 day history of flank pain. The pain is located in his right flank and does not radiate. The pain is described as aching and severe. The pain started gradually and progressively worsened since the onset. No alleviating/aggravating factors. The patient has tried nothing for symptoms without relief. Associated symptoms include lower abdominal pain and hematuria. Patient denies fever, headache, NVD, chest pain, SOB, dysuria, constipation.   Patient is a 42 y.o. male presenting with hematuria, urinary tract infection, flank pain, and hypertension.  Hematuria Associated symptoms include abdominal pain.  Urinary Tract Infection Associated symptoms include abdominal pain.  Flank Pain Associated symptoms include abdominal pain.  Hypertension Associated symptoms include abdominal pain.    Past Medical History  Diagnosis Date  . Hypertension   . UTI (lower urinary tract infection)    Past Surgical History  Procedure Laterality Date  . Back surgery     No family history on file. History  Substance Use Topics  . Smoking status: Current Every Day Smoker -- 1.00 packs/day for 30 years    Types: Cigarettes  . Smokeless tobacco: Former Neurosurgeon  . Alcohol Use: No    Review of Systems  Gastrointestinal: Positive for abdominal pain.  Genitourinary: Positive for hematuria and flank pain.  All other systems reviewed and are negative.    Allergies  Review of patient's allergies indicates no known allergies.  Home Medications   Current Outpatient Rx  Name   Route  Sig  Dispense  Refill  . atenolol (TENORMIN) 100 MG tablet   Oral   Take 100 mg by mouth every morning.          [name] lisinopril (PRINIVIL,ZESTRIL) 20 MG tablet   Oral   Take 20 mg by mouth every morning.          [name] oxyCODONE (OXYCONTIN) 20 MG 12 hr tablet   Oral   Take 20 mg by mouth every 12 (twelve) hours as needed for pain.          BP 138/79  Pulse 83  Temp(Src) 99.7 F (37.6 C) (Oral)  Resp 16  SpO2 98% Physical Exam  Nursing note and vitals reviewed. Constitutional: He is oriented to person, place, and time. He appears well-developed and well-nourished. No distress.  HENT:  Head: Normocephalic and atraumatic.  Eyes: Conjunctivae and EOM are normal.  Cardiovascular: Normal rate and regular rhythm.  Exam reveals no gallop and no friction rub.   No murmur heard. Pulmonary/Chest: Effort normal and breath sounds normal. He has no wheezes. He has no rales. He exhibits no tenderness.  Abdominal: Soft. He exhibits no distension. There is tenderness. There is no rebound and no guarding.  Mild suprapubic tenderness to palpation. No peritoneal signs.   Musculoskeletal: Normal range of motion.  Neurological: He is alert and oriented to person, place, and time. Coordination normal.  Speech is goal-oriented. Moves limbs without ataxia.   Skin: Skin is warm and dry.  Psychiatric: He has a normal mood and affect. His behavior is normal.    ED Course  Procedures (including critical care  time) Labs Review Labs Reviewed  URINALYSIS, ROUTINE W REFLEX MICROSCOPIC - Abnormal; Notable for the following:    APPearance CLOUDY (*)    Hgb urine dipstick LARGE (*)    Protein, ur 30 (*)    Nitrite POSITIVE (*)    Leukocytes, UA MODERATE (*)    All other components within normal limits  CBC WITH DIFFERENTIAL - Abnormal; Notable for the following:    WBC 13.6 (*)    Neutro Abs 10.3 (*)    Monocytes Absolute 1.4 (*)    All other components within normal limits  BASIC METABOLIC  PANEL - Abnormal; Notable for the following:    Glucose, Bld 109 (*)    GFR calc non Af Amer 85 (*)    All other components within normal limits  URINE MICROSCOPIC-ADD ON - Abnormal; Notable for the following:    Bacteria, UA MANY (*)    All other components within normal limits  URINE CULTURE   Imaging Review No results found.  EKG Interpretation   None       MDM   1. Pyelonephritis     12:22 AM Labs and urinalysis pending. Vitals stable and patient afebrile. Patient will have morphine, zofran and fluids.   Patient's urinalysis shows UTI. Patient's WBC elevated. Patient will receive IV antibiotics and be discharged home if patient is feeling better. Patient signed out to Elpidio Anis, PA-C pending disposition.     Emilia Beck, [place] 06/08/13 [phone]

## 2013-06-08 LAB — URINE CULTURE: Colony Count: >=100000

## 2013-06-14 NOTE — ED Provider Notes (Signed)
Medical screening examination/treatment/procedure(s) were conducted as a shared visit with non-physician practitioner(s) and myself.  I personally evaluated the patient during the encounter.  EKG Interpretation   None        Overall well appearing. Dc home when feeling better.   Lyanne Co, MD 06/14/13 [phone]

## 2015-09-10 ENCOUNTER — Emergency Department (HOSPITAL_COMMUNITY)
Admission: EM | Admit: 2015-09-10 | Discharge: 2015-09-10 | Disposition: A | Discharge status: Home / Self Care | Payer: Medicaid Other | Attending: Emergency Medicine | Admitting: Emergency Medicine

## 2015-09-10 ENCOUNTER — Emergency Department (HOSPITAL_COMMUNITY): Payer: Medicaid Other

## 2015-09-10 DIAGNOSIS — S71132A Puncture wound without foreign body, left thigh, initial encounter: Secondary | ICD-10-CM | POA: Insufficient documentation

## 2015-09-10 DIAGNOSIS — Z7984 Long term (current) use of oral hypoglycemic drugs: Secondary | ICD-10-CM | POA: Insufficient documentation

## 2015-09-10 DIAGNOSIS — I4891 Unspecified atrial fibrillation: Secondary | ICD-10-CM | POA: Insufficient documentation

## 2015-09-10 DIAGNOSIS — E119 Type 2 diabetes mellitus without complications: Secondary | ICD-10-CM | POA: Insufficient documentation

## 2015-09-10 DIAGNOSIS — Y9289 Other specified places as the place of occurrence of the external cause: Secondary | ICD-10-CM | POA: Insufficient documentation

## 2015-09-10 DIAGNOSIS — I10 Essential (primary) hypertension: Secondary | ICD-10-CM | POA: Insufficient documentation

## 2015-09-10 DIAGNOSIS — Y9389 Activity, other specified: Secondary | ICD-10-CM | POA: Insufficient documentation

## 2015-09-10 DIAGNOSIS — Z7901 Long term (current) use of anticoagulants: Secondary | ICD-10-CM | POA: Insufficient documentation

## 2015-09-10 DIAGNOSIS — Z8719 Personal history of other diseases of the digestive system: Secondary | ICD-10-CM | POA: Insufficient documentation

## 2015-09-10 DIAGNOSIS — Z87891 Personal history of nicotine dependence: Secondary | ICD-10-CM | POA: Insufficient documentation

## 2015-09-10 DIAGNOSIS — I4892 Unspecified atrial flutter: Secondary | ICD-10-CM | POA: Insufficient documentation

## 2015-09-10 DIAGNOSIS — Z9889 Other specified postprocedural states: Secondary | ICD-10-CM | POA: Insufficient documentation

## 2015-09-10 DIAGNOSIS — Z862 Personal history of diseases of the blood and blood-forming organs and certain disorders involving the immune mechanism: Secondary | ICD-10-CM | POA: Insufficient documentation

## 2015-09-10 DIAGNOSIS — Z8701 Personal history of pneumonia (recurrent): Secondary | ICD-10-CM | POA: Insufficient documentation

## 2015-09-10 DIAGNOSIS — Z8711 Personal history of peptic ulcer disease: Secondary | ICD-10-CM | POA: Insufficient documentation

## 2015-09-10 DIAGNOSIS — Z23 Encounter for immunization: Secondary | ICD-10-CM | POA: Insufficient documentation

## 2015-09-10 DIAGNOSIS — Y998 Other external cause status: Secondary | ICD-10-CM | POA: Insufficient documentation

## 2015-09-10 DIAGNOSIS — Z87438 Personal history of other diseases of male genital organs: Secondary | ICD-10-CM | POA: Insufficient documentation

## 2015-09-10 DIAGNOSIS — Z79899 Other long term (current) drug therapy: Secondary | ICD-10-CM | POA: Insufficient documentation

## 2015-09-10 DIAGNOSIS — W3400XA Accidental discharge from unspecified firearms or gun, initial encounter: Secondary | ICD-10-CM

## 2015-09-10 DIAGNOSIS — Z8619 Personal history of other infectious and parasitic diseases: Secondary | ICD-10-CM | POA: Insufficient documentation

## 2015-09-10 LAB — COMPREHENSIVE METABOLIC PANEL
ALK PHOS: 92 U/L (ref 38–126)
ALT: 18 U/L (ref 17–63)
ANION GAP: 14 (ref 5–15)
AST: 25 U/L (ref 15–41)
Albumin: 3.3 g/dL — ABNORMAL LOW (ref 3.5–5.0)
BILIRUBIN TOTAL: 0.4 mg/dL (ref 0.3–1.2)
BUN: 11 mg/dL (ref 6–20)
CHLORIDE: 103 mmol/L (ref 101–111)
CO2: 22 mmol/L (ref 22–32)
CREATININE: 1.27 mg/dL — AB (ref 0.61–1.24)
Calcium: 9 mg/dL (ref 8.9–10.3)
GLUCOSE: 130 mg/dL — AB (ref 65–99)
Potassium: 3.7 mmol/L (ref 3.5–5.1)
SODIUM: 139 mmol/L (ref 135–145)
Total Protein: 7.6 g/dL (ref 6.5–8.1)

## 2015-09-10 LAB — PREPARE FRESH FROZEN PLASMA
Unit division: 0
Unit division: 0

## 2015-09-10 LAB — TYPE AND SCREEN
ABO/RH(D): B NEG
Antibody Screen: NEGATIVE
UNIT DIVISION: 0
Unit division: 0

## 2015-09-10 LAB — I-STAT CHEM 8, ED
BUN: 15 mg/dL (ref 6–20)
CALCIUM ION: 1.04 mmol/L — AB (ref 1.12–1.23)
Chloride: 104 mmol/L (ref 101–111)
Creatinine, Ser: 1.2 mg/dL (ref 0.61–1.24)
Glucose, Bld: 124 mg/dL — ABNORMAL HIGH (ref 65–99)
HCT: 43 % (ref 39.0–52.0)
Hemoglobin: 14.6 g/dL (ref 13.0–17.0)
Potassium: 3.7 mmol/L (ref 3.5–5.1)
SODIUM: 141 mmol/L (ref 135–145)
TCO2: 23 mmol/L (ref 0–100)

## 2015-09-10 LAB — CBC
HEMATOCRIT: 39.2 % (ref 39.0–52.0)
Hemoglobin: 12.7 g/dL — ABNORMAL LOW (ref 13.0–17.0)
MCH: 28.2 pg (ref 26.0–34.0)
MCHC: 32.4 g/dL (ref 30.0–36.0)
MCV: 86.9 fL (ref 78.0–100.0)
PLATELETS: 357 10*3/uL (ref 150–400)
RBC: 4.51 MIL/uL (ref 4.22–5.81)
RDW: 13.7 % (ref 11.5–15.5)
WBC: 10 10*3/uL (ref 4.0–10.5)

## 2015-09-10 LAB — ETHANOL: Alcohol, Ethyl (B): <5 mg/dL (ref <5)

## 2015-09-10 LAB — I-STAT CG4 LACTIC ACID, ED: Lactic Acid, Venous: 3.77 mmol/L (ref 0.5–2.0)

## 2015-09-10 LAB — CDS SEROLOGY

## 2015-09-10 LAB — PROTIME-INR
INR: 1.15 (ref 0.00–1.49)
Prothrombin Time: 14.9 seconds (ref 11.6–15.2)

## 2015-09-10 LAB — ABO/RH: ABO/RH(D): B NEG

## 2015-09-10 MED ORDER — MORPHINE SULFATE (PF) 4 MG/ML IV SOLN
4.0000 mg | Freq: Once | INTRAVENOUS | Status: AC
  Administered 2015-09-10: 4 mg via INTRAVENOUS
  Filled 2015-09-10: qty 1

## 2015-09-10 MED ORDER — IOHEXOL 350 MG/ML SOLN
80.0000 mL | Freq: Once | INTRAVENOUS | Status: AC | PRN
  Administered 2015-09-10: 100 mL via INTRAVENOUS

## 2015-09-10 MED ORDER — TETANUS-DIPHTH-ACELL PERTUSSIS 5-2.5-18.5 LF-MCG/0.5 IM SUSP
INTRAMUSCULAR | Status: AC
  Administered 2015-09-10: 0.5 mL via INTRAMUSCULAR
  Filled 2015-09-10: qty 0.5

## 2015-09-10 MED ORDER — HYDROCODONE-ACETAMINOPHEN 5-325 MG PO TABS: 1.0000 | ORAL_TABLET | ORAL | Status: DC | PRN

## 2015-09-10 MED ORDER — TETANUS-DIPHTH-ACELL PERTUSSIS 5-2.5-18.5 LF-MCG/0.5 IM SUSP
0.5000 mL | Freq: Once | INTRAMUSCULAR | Status: AC
  Administered 2015-09-10: 0.5 mL via INTRAMUSCULAR
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
## 2015-09-10 NOTE — ED Notes (Signed)
Patient returned to room from CT. 
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
## 2015-09-10 NOTE — ED Notes (Signed)
Difficulty in obtaining IV access. MD at bedside attempting.
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
## 2015-09-12 MED ORDER — IOHEXOL 350 MG/ML SOLN
80.0000 mL | Freq: Once | INTRAVENOUS | Status: AC | PRN
  Administered 2015-09-10: 80 mL via INTRAVENOUS

## 2015-11-07 ENCOUNTER — Encounter (HOSPITAL_COMMUNITY): Payer: Self-pay

## 2015-11-07 ENCOUNTER — Emergency Department (HOSPITAL_COMMUNITY): Payer: Self-pay

## 2015-11-07 ENCOUNTER — Observation Stay (HOSPITAL_COMMUNITY)
Admission: EM | Admit: 2015-11-07 | Discharge: 2015-11-08 | Disposition: A | Discharge status: Home / Self Care | Payer: Self-pay | Attending: Internal Medicine | Admitting: Internal Medicine

## 2015-11-07 DIAGNOSIS — F17210 Nicotine dependence, cigarettes, uncomplicated: Secondary | ICD-10-CM | POA: Insufficient documentation

## 2015-11-07 DIAGNOSIS — Z79899 Other long term (current) drug therapy: Secondary | ICD-10-CM | POA: Insufficient documentation

## 2015-11-07 DIAGNOSIS — F329 Major depressive disorder, single episode, unspecified: Secondary | ICD-10-CM | POA: Insufficient documentation

## 2015-11-07 DIAGNOSIS — T50901A Poisoning by unspecified drugs, medicaments and biological substances, accidental (unintentional), initial encounter: Secondary | ICD-10-CM | POA: Diagnosis present

## 2015-11-07 DIAGNOSIS — Z720 Tobacco use: Secondary | ICD-10-CM | POA: Diagnosis present

## 2015-11-07 DIAGNOSIS — F1110 Opioid abuse, uncomplicated: Secondary | ICD-10-CM | POA: Insufficient documentation

## 2015-11-07 DIAGNOSIS — G9340 Encephalopathy, unspecified: Principal | ICD-10-CM | POA: Diagnosis present

## 2015-11-07 DIAGNOSIS — F1910 Other psychoactive substance abuse, uncomplicated: Secondary | ICD-10-CM | POA: Diagnosis present

## 2015-11-07 DIAGNOSIS — I10 Essential (primary) hypertension: Secondary | ICD-10-CM | POA: Diagnosis present

## 2015-11-07 DIAGNOSIS — R4182 Altered mental status, unspecified: Secondary | ICD-10-CM | POA: Insufficient documentation

## 2015-11-07 DIAGNOSIS — T50904A Poisoning by unspecified drugs, medicaments and biological substances, undetermined, initial encounter: Secondary | ICD-10-CM

## 2015-11-07 DIAGNOSIS — J020 Streptococcal pharyngitis: Secondary | ICD-10-CM | POA: Insufficient documentation

## 2015-11-07 DIAGNOSIS — F1410 Cocaine abuse, uncomplicated: Secondary | ICD-10-CM | POA: Insufficient documentation

## 2015-11-07 HISTORY — DX: Tobacco use: Z72.0

## 2015-11-07 HISTORY — DX: Other psychoactive substance abuse, uncomplicated: F19.10

## 2015-11-07 LAB — I-STAT CHEM 8, ED
BUN: 10 mg/dL (ref 6–20)
CALCIUM ION: 1.07 mmol/L — AB (ref 1.12–1.23)
CREATININE: 0.7 mg/dL (ref 0.61–1.24)
Chloride: 102 mmol/L (ref 101–111)
GLUCOSE: 80 mg/dL (ref 65–99)
HCT: 42 % (ref 39.0–52.0)
Hemoglobin: 14.3 g/dL (ref 13.0–17.0)
POTASSIUM: 3.7 mmol/L (ref 3.5–5.1)
Sodium: 141 mmol/L (ref 135–145)
TCO2: 26 mmol/L (ref 0–100)

## 2015-11-07 LAB — CBC
HCT: 38.8 % — ABNORMAL LOW (ref 39.0–52.0)
HEMOGLOBIN: 12.4 g/dL — AB (ref 13.0–17.0)
MCH: 28.2 pg (ref 26.0–34.0)
MCHC: 32 g/dL (ref 30.0–36.0)
MCV: 88.4 fL (ref 78.0–100.0)
PLATELETS: 241 10*3/uL (ref 150–400)
RBC: 4.39 MIL/uL (ref 4.22–5.81)
RDW: 13.5 % (ref 11.5–15.5)
WBC: 8.8 10*3/uL (ref 4.0–10.5)

## 2015-11-07 LAB — ACETAMINOPHEN LEVEL

## 2015-11-07 LAB — TROPONIN I: Troponin I: <0.03 ng/mL (ref <0.031)

## 2015-11-07 LAB — ETHANOL: Alcohol, Ethyl (B): <5 mg/dL (ref <5)

## 2015-11-07 LAB — SALICYLATE LEVEL: Salicylate Lvl: <4 mg/dL (ref 2.8–30.0)

## 2015-11-07 LAB — I-STAT TROPONIN, ED: TROPONIN I, POC: 0 ng/mL (ref 0.00–0.08)

## 2015-11-07 MED ORDER — ONDANSETRON HCL 4 MG/2ML IJ SOLN: 4.0000 mg | Freq: Three times a day (TID) | INTRAMUSCULAR | Status: DC | PRN

## 2015-11-07 MED ORDER — SODIUM CHLORIDE 0.9% FLUSH: 3.0000 mL | Freq: Two times a day (BID) | INTRAVENOUS | Status: DC

## 2015-11-07 MED ORDER — SODIUM CHLORIDE 0.9 % IV SOLN
INTRAVENOUS | Status: DC
  Administered 2015-11-08 (×2): via INTRAVENOUS

## 2015-11-07 MED ORDER — LORAZEPAM 2 MG/ML IJ SOLN: 1.0000 mg | INTRAMUSCULAR | Status: DC | PRN

## 2015-11-07 MED ORDER — ACETAMINOPHEN 650 MG RE SUPP: 650.0000 mg | Freq: Four times a day (QID) | RECTAL | Status: DC | PRN

## 2015-11-07 MED ORDER — SODIUM CHLORIDE 0.9 % IV BOLUS (SEPSIS)
1000.0000 mL | Freq: Once | INTRAVENOUS | Status: AC
  Administered 2015-11-07: 1000 mL via INTRAVENOUS

## 2015-11-07 MED ORDER — NICOTINE 21 MG/24HR TD PT24
21.0000 mg | MEDICATED_PATCH | Freq: Every day | TRANSDERMAL | Status: DC
  Filled 2015-11-07: qty 1

## 2015-11-07 MED ORDER — ACETAMINOPHEN 325 MG PO TABS: 650.0000 mg | ORAL_TABLET | Freq: Four times a day (QID) | ORAL | Status: DC | PRN

## 2015-11-07 MED ORDER — ENOXAPARIN SODIUM 40 MG/0.4ML ~~LOC~~ SOLN
40.0000 mg | SUBCUTANEOUS | Status: DC
  Administered 2015-11-08: 40 mg via SUBCUTANEOUS
  Filled 2015-11-07: qty 0.4

## 2015-11-07 MED ORDER — HYDRALAZINE HCL 20 MG/ML IJ SOLN: 5.0000 mg | INTRAMUSCULAR | Status: DC | PRN
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
## 2015-11-07 NOTE — ED Notes (Signed)
Pt transported to CT with this RN 
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
## 2015-11-07 NOTE — ED Notes (Signed)
Unable to collect urine at this time, pt very combative on arrival, pt is sedated with  Versed given during day shift, but easy arouse with verba stimulation.

## 2015-11-07 NOTE — ED Provider Notes (Signed)
CSN: [id number]     Arrival date & time 11/07/15  1535 History   First MD Initiated Contact with Patient 11/07/15 1539     Chief Complaint  Patient presents with  . Drug Problem      HPI 45 y.o. male with known history of heroin and crack cocaine abuse who is brought in by EMS for erratic behavior. The Police Department was called out to the scene of a hotel where the patient was noted to be very anxious and exhibiting aggressive behavior, banging his head against the wall. On EMS arrival, the patient complained of palpitations but no other physical complaints at that time. He required 2.5 mg of IM Versed to get him in the ambulance and then an additional 2.5 mg en route to the ED in order to safely keep him in the ambulance for transport. It is unclear when he last used. Oxygenating well status post receiving Versed. Unable to obtain further history at this time.   Past Medical History  Diagnosis Date  . Hypertension   . UTI (lower urinary tract infection)   . Polysubstance abuse   . Tobacco abuse    Past Surgical History  Procedure Laterality Date  . Back surgery     History reviewed. No pertinent family history. Social History  Substance Use Topics  . Smoking status: Current Every Day Smoker -- 1.00 packs/day for 30 years    Types: Cigarettes  . Smokeless tobacco: Former Neurosurgeon  . Alcohol Use: No    Review of Systems  Unable to perform ROS: Mental status change      Allergies  Review of patient's allergies indicates no known allergies.  Home Medications   Prior to Admission medications   Medication Sig Start Date End Date Taking? Authorizing Provider  atenolol (TENORMIN) 100 MG tablet Take 100 mg by mouth every morning.    Yes Historical Provider, MD  lisinopril (PRINIVIL,ZESTRIL) 20 MG tablet Take 20 mg by mouth every morning.    Yes Historical Provider, MD  oxyCODONE (OXYCONTIN) 20 MG 12 hr tablet Take 20 mg by mouth every 12 (twelve) hours as needed for pain.   Yes  Historical Provider, MD   BP 125/74 mmHg  Pulse 61  Temp(Src) 98.2 F (36.8 C) (Oral)  Resp 18  SpO2 100% Physical Exam  Constitutional: He appears well-developed and well-nourished. No distress.  somnolent  HENT:  Head: Normocephalic and atraumatic.  Right Ear: External ear normal.  Left Ear: External ear normal.  Nose: Nose normal.  Mouth/Throat: Oropharynx is clear and moist. No oropharyngeal exudate.  Eyes: Conjunctivae are normal. Pupils are equal, round, and reactive to light. Right eye exhibits no discharge. Left eye exhibits no discharge. No scleral icterus.  Pinpoint pupils  Neck: Normal range of motion. Neck supple. No tracheal deviation present.  Cardiovascular: Normal rate, regular rhythm and normal heart sounds.  Exam reveals no gallop and no friction rub.   No murmur heard. Pulmonary/Chest: Effort normal and breath sounds normal. No respiratory distress. He has no wheezes. He has no rales.  Abdominal: Soft. Bowel sounds are normal. He exhibits no distension and no mass. There is no tenderness. There is no rebound and no guarding.  Musculoskeletal: Normal range of motion. He exhibits no edema or tenderness.  Neurological: He exhibits normal muscle tone.  Somnolent, moves all 4 extremities. Awakens to painful stimuli and withdraws and localizes noxious stimuli.   Skin: Skin is warm and dry. No rash noted. He is not diaphoretic.  ED Course  Procedures (including critical care time) Labs Review Labs Reviewed  CBC - Abnormal; Notable for the following:    Hemoglobin 12.4 (*)    HCT 38.8 (*)    All other components within normal limits  ACETAMINOPHEN LEVEL - Abnormal; Notable for the following:    Acetaminophen (Tylenol), Serum <10 (*)    All other components within normal limits  I-STAT CHEM 8, ED - Abnormal; Notable for the following:    Calcium, Ion 1.07 (*)    All other components within normal limits  ETHANOL  TROPONIN I  SALICYLATE LEVEL  URINE RAPID  DRUG SCREEN, HOSP PERFORMED  COMPREHENSIVE METABOLIC PANEL  CBC  TROPONIN I  TROPONIN I  TROPONIN I  I-STAT TROPOININ, ED    Imaging Review Ct Head Wo Contrast  11/07/2015  CLINICAL DATA:  Altered mental status today. No known injury. Initial encounter. EXAM: CT HEAD WITHOUT CONTRAST TECHNIQUE: Contiguous axial images were obtained from the base of the skull through the vertex without intravenous contrast. COMPARISON:  None. FINDINGS: The brain appears normal without hemorrhage, infarct, mass lesion, mass effect, midline shift or abnormal extra-axial fluid collection. No hydrocephalus or pneumocephalus. The calvarium is intact. Imaged paranasal sinuses and mastoid air cells are clear. IMPRESSION: Negative head CT. Electronically Signed   By: Drusilla Kannerhomas  Dalessio M.D.   On: 11/07/2015 17:27   Dg Chest Port 1 View  11/07/2015  CLINICAL DATA:  Chest pain today.  Initial encounter. EXAM: PORTABLE CHEST 1 VIEW COMPARISON:  PA and lateral chest 04/25/2008. FINDINGS: The lungs are clear. Heart size is normal. No pneumothorax or pleural effusion. No focal bony abnormality. IMPRESSION: Negative chest. Electronically Signed   By: Drusilla Kannerhomas  Dalessio M.D.   On: 11/07/2015 17:14   I have personally reviewed and evaluated these images and lab results as part of my medical decision-making.   EKG Interpretation   Date/Time:  Tuesday November 07 2015 15:39:56 EDT Ventricular Rate:  82 PR Interval:  134 QRS Duration: 101 QT Interval:  401 QTC Calculation: 468 R Axis:   28 Text Interpretation:  Sinus rhythm Consider left atrial enlargement  Abnormal R-wave progression, early transition Borderline ST elevation,  lateral leads similar compared to priors Confirmed by Lincoln Brighamees, Liz [phone]([id number]) on  11/07/2015 8:33:58 PM      MDM   Final diagnoses:  Drug overdose, undetermined intent, initial encounter   On arrival the patient is somnolent. Arouses to noxious stimuli. Noted to be moving all 4 extremities. Protecting  his airway. Satting in the high 90s on room air. Remainder of neurologic exam as above. Troponin undetectable, CT head without acute intracranial abnormalities, and remainder of labs unremarkable. UDS pending. After 6 hours of observation in the emergency department, patient was minimally more interactive but still would arouse only to physical stimuli. We'll admit to medicine service for observation and to continue to allow the patient to metabolize his substances. Suspect intoxication as the cause of his symptoms. Given remainder of negative workup, doubt additional underlying etiology.      Nike Southers Ernestina PennaBrunno Jazmine Longshore, MD 11/08/15 0120  Tilden FossaElizabeth Rees, MD 11/11/15 [id number]

## 2015-11-08 DIAGNOSIS — F1494 Cocaine use, unspecified with cocaine-induced mood disorder: Secondary | ICD-10-CM

## 2015-11-08 DIAGNOSIS — F1910 Other psychoactive substance abuse, uncomplicated: Secondary | ICD-10-CM

## 2015-11-08 LAB — CBC
HCT: 43.1 % (ref 39.0–52.0)
Hemoglobin: 13.6 g/dL (ref 13.0–17.0)
MCH: 28.2 pg (ref 26.0–34.0)
MCHC: 31.6 g/dL (ref 30.0–36.0)
MCV: 89.2 fL (ref 78.0–100.0)
PLATELETS: 239 10*3/uL (ref 150–400)
RBC: 4.83 MIL/uL (ref 4.22–5.81)
RDW: 13.8 % (ref 11.5–15.5)
WBC: 7.7 10*3/uL (ref 4.0–10.5)

## 2015-11-08 LAB — RAPID URINE DRUG SCREEN, HOSP PERFORMED
AMPHETAMINES: NOT DETECTED
BENZODIAZEPINES: POSITIVE — AB
Barbiturates: NOT DETECTED
Cocaine: POSITIVE — AB
OPIATES: POSITIVE — AB
Tetrahydrocannabinol: NOT DETECTED

## 2015-11-08 LAB — COMPREHENSIVE METABOLIC PANEL
ALBUMIN: 3.1 g/dL — AB (ref 3.5–5.0)
ALT: 26 U/L (ref 17–63)
ANION GAP: 14 (ref 5–15)
AST: 42 U/L — ABNORMAL HIGH (ref 15–41)
Alkaline Phosphatase: 79 U/L (ref 38–126)
BUN: 7 mg/dL (ref 6–20)
CALCIUM: 8.7 mg/dL — AB (ref 8.9–10.3)
CHLORIDE: 104 mmol/L (ref 101–111)
CO2: 22 mmol/L (ref 22–32)
CREATININE: 0.78 mg/dL (ref 0.61–1.24)
GFR calc non Af Amer: >60 mL/min (ref >60)
Glucose, Bld: 65 mg/dL (ref 65–99)
Potassium: 4 mmol/L (ref 3.5–5.1)
SODIUM: 140 mmol/L (ref 135–145)
TOTAL PROTEIN: 6.3 g/dL — AB (ref 6.5–8.1)
Total Bilirubin: 1.1 mg/dL (ref 0.3–1.2)

## 2015-11-08 LAB — GLUCOSE, CAPILLARY
GLUCOSE-CAPILLARY: 59 mg/dL — AB (ref 65–99)
GLUCOSE-CAPILLARY: 91 mg/dL (ref 65–99)

## 2015-11-08 LAB — TROPONIN I

## 2015-11-08 LAB — RAPID STREP SCREEN (MED CTR MEBANE ONLY): STREPTOCOCCUS, GROUP A SCREEN (DIRECT): POSITIVE — AB

## 2015-11-08 MED ORDER — OXYCODONE HCL 5 MG PO TABS
5.0000 mg | ORAL_TABLET | ORAL | Status: DC | PRN
  Administered 2015-11-08: 5 mg via ORAL
  Filled 2015-11-08: qty 1

## 2015-11-08 MED ORDER — DEXTROSE 50 % IV SOLN
INTRAVENOUS | Status: AC
  Administered 2015-11-08: 25 mL
  Filled 2015-11-08: qty 50

## 2015-11-08 MED ORDER — KETOROLAC TROMETHAMINE 30 MG/ML IJ SOLN
30.0000 mg | Freq: Once | INTRAMUSCULAR | Status: DC
  Filled 2015-11-08: qty 1

## 2015-11-08 MED ORDER — PENICILLIN G BENZATHINE 1200000 UNIT/2ML IM SUSP
1.2000 10*6.[IU] | Freq: Once | INTRAMUSCULAR | Status: AC
Start: 2015-11-08 — End: 2015-11-08
  Administered 2015-11-08: 1.2 10*6.[IU] via INTRAMUSCULAR
  Filled 2015-11-08: qty 2

## 2015-11-08 NOTE — Progress Notes (Signed)
CSW consulted regarding transportation. Patient stated he did not have a way home and asked for a taxi voucher. CSW asked if he could take the bus and patient agreed. CSW provided bus pass to the patient.  CSW signing off.  Osborne Cascoadia Tion Tse LCSWA [phone]

## 2015-11-08 NOTE — Progress Notes (Signed)
Pt stated that he's feeling depressed. I asked if he has thoughts of harming himself or others and he said NO. He would just like to get help. Will continue to monitor pt closely and reported off to oncoming RN.

## 2015-11-08 NOTE — Progress Notes (Signed)
Patient D/C home with brother. Patient D/C instructions giving to patient. Transportation notified for patient. IV removed. HR monitor removed. Patient ambulated to D/C home.   Valinda HoarLexie Holiday Mcmenamin RN

## 2015-11-08 NOTE — Discharge Summary (Signed)
Physician Discharge Summary  Eric Chang WUJ:[id number] DOB: Dec 17, 1970 DOA: 11/07/2015  PCP: Burtis Junes, MD  Admit date: 11/07/2015 Discharge date: 11/08/2015  Time spent: < 30 minutes  Recommendations for Outpatient Follow-up:  1. Follow up with PCP in 1 week   Discharge Diagnoses:  Principal Problem:   Polysubstance abuse Active Problems:   Acute encephalopathy   Overdose   Essential hypertension   Tobacco abuse  Discharge Condition: stable  Diet recommendation: regular  Filed Weights   11/08/15 0558  Weight: 119.886 kg (264 lb 4.8 oz)    History of present illness:  See H&P, Labs, Consult and Test reports for all details in brief, patient is a 45 y.o. male with PMH of hypertension, polysubstance abuse including heroine, crack or cocaine and tobacco, who presents with erratic behavior, palpitaiton and AMS  Hospital Course:  Patient was admitted to the hospital with acute encephalopathy and erratic behavior. This was without any clear etiology, however it was most likely related to his polysubstance abuse as evidenced by positive urine drug screen. He was monitored overnight, he is improved and is back to his baseline. He endorses active depression without any suicidal or homicidal ideation, for which psychiatry was consulted and have evaluated the patient while hospitalized. Patient is refusing any form of treatment for his depression, he is refusing medication or outpatient support. He does not meet criteria for acute psychiatric hospitalization. He was stable, returned to baseline, and was discharged home in stable condition. He was counseled extensively regarding drug use. He did complain of a sore throat, appears to have whitish exudates on exam, strep test was positive and he received treatment with IM penicillin 1 dose prior to discharge.   Procedures:  None   Consultations:  Psychiatry   Discharge Exam: Filed Vitals:   11/07/15 2254 11/08/15  0035 11/08/15 0558 11/08/15 1225  BP:  125/74 113/63 130/73  Pulse:  61 72 64  Temp: 97.8 F (36.6 C) 98.2 F (36.8 C) 97.9 F (36.6 C) 99.1 F (37.3 C)  TempSrc: Oral Oral Oral Oral  Resp:  Height:    (1.905 m)   Weight:   119.886 kg (264 lb 4.8 oz)   SpO2:  100% 100% 100%    General: NAD Cardiovascular: RRR Respiratory: CTA biL  Discharge Instructions Activity:  As tolerated   Get Medicines reviewed and adjusted: Please take all your medications with you for your next visit with your Primary MD  Please request your Primary MD to go over all hospital tests and procedure/radiological results at the follow up, please ask your Primary MD to get all Hospital records sent to his/her office.  If you experience worsening of your admission symptoms, develop shortness of breath, life threatening emergency, suicidal or homicidal thoughts you must seek medical attention immediately by calling 911 or calling your MD immediately if symptoms less severe.  You must read complete instructions/literature along with all the possible adverse reactions/side effects for all the Medicines you take and that have been prescribed to you. Take any new Medicines after you have completely understood and accpet all the possible adverse reactions/side effects.   Do not drive when taking Pain medications.   Do not take more than prescribed Pain, Sleep and Anxiety Medications  Special Instructions: If you have smoked or chewed Tobacco in the last 2 yrs please stop smoking, stop any regular Alcohol and or any Recreational drug use.  Wear Seat belts while driving.  Please  note  You were cared for by a hospitalist during your hospital stay. Once you are discharged, your primary care physician will handle any further medical issues. Please note that NO REFILLS for any discharge medications will be authorized once you are discharged, as it is imperative that you return to your primary care  physician (or establish a relationship with a primary care physician if you do not have one) for your aftercare needs so that they can reassess your need for medications and monitor your lab values.    Medication List    TAKE these medications        atenolol 100 MG tablet  Commonly known as:  TENORMIN  Take 100 mg by mouth every morning.     lisinopril 20 MG tablet  Commonly known as:  PRINIVIL,ZESTRIL  Take 20 mg by mouth every morning.     oxyCODONE 20 MG 12 hr tablet  Commonly known as:  OXYCONTIN  Take 20 mg by mouth every 12 (twelve) hours as needed for pain.           Follow-up Information    Follow up with Burtis Junes, MD.   Specialty:  Family Medicine   Why:  As needed      The results of significant diagnostics from this hospitalization (including imaging, microbiology, ancillary and laboratory) are listed below for reference.    Significant Diagnostic Studies: Ct Head Wo Contrast  11/07/2015  CLINICAL DATA:  Altered mental status today. No known injury. Initial encounter. EXAM: CT HEAD WITHOUT CONTRAST TECHNIQUE: Contiguous axial images were obtained from the base of the skull through the vertex without intravenous contrast. COMPARISON:  None. FINDINGS: The brain appears normal without hemorrhage, infarct, mass lesion, mass effect, midline shift or abnormal extra-axial fluid collection. No hydrocephalus or pneumocephalus. The calvarium is intact. Imaged paranasal sinuses and mastoid air cells are clear. IMPRESSION: Negative head CT. Electronically Signed   By: Drusilla Kanner M.D.   On: 11/07/2015 17:27   Dg Chest Port 1 View  11/07/2015  CLINICAL DATA:  Chest pain today.  Initial encounter. EXAM: PORTABLE CHEST 1 VIEW COMPARISON:  PA and lateral chest 04/25/2008. FINDINGS: The lungs are clear. Heart size is normal. No pneumothorax or pleural effusion. No focal bony abnormality. IMPRESSION: Negative chest. Electronically Signed   By: Drusilla Kanner M.D.    On: 11/07/2015 17:14    Microbiology: Recent Results (from the past 240 hour(s))  Rapid strep screen (not at [facility])     Status: Abnormal   Collection Time: 11/08/15 12:59 PM  Result Value Ref Range Status   Streptococcus, Group A Screen (Direct) POSITIVE (A) NEGATIVE Final     Labs: Basic Metabolic Panel:  Recent Labs Lab 11/07/15 1705 11/08/15 0612  NA 141 140  K 3.7 4.0  CL 102 104  CO2  --  22  GLUCOSE 80 65  BUN 10 7  CREATININE 0.70 0.78  CALCIUM  --  8.7*   Liver Function Tests:  Recent Labs Lab 11/08/15 0612  AST 42*  ALT 26  ALKPHOS 79  BILITOT 1.1  PROT 6.3*  ALBUMIN 3.1*   No results for input(s): LIPASE, AMYLASE in the last 168 hours. No results for input(s): AMMONIA in the last 168 hours. CBC:  Recent Labs Lab 11/07/15 1641 11/07/15 1705 11/08/15 0612  WBC 8.8  --  7.7  HGB 12.4* 14.3 13.6  HCT 38.8* 42.0 43.1  MCV 88.4  --  89.2  PLT 241  --  239  Cardiac Enzymes:  Recent Labs Lab 11/07/15 2139 11/08/15 0112 11/08/15 0612 11/08/15 1110  TROPONINI <0.03 <0.03 <0.03 <0.03   BNP: BNP (last 3 results) No results for input(s): BNP in the last 8760 hours.  ProBNP (last 3 results) No results for input(s): PROBNP in the last 8760 hours.  CBG:  Recent Labs Lab 11/08/15 0754 11/08/15 0824  GLUCAP 59* 91     Signed:  Pamala Hayman  [facility] 11/08/2015, 4:52 PM

## 2015-11-08 NOTE — Progress Notes (Signed)
Pt arrived to unit with ED RN. He is drowsy. Able to scoot himself from stretcher to bed. Vitals are stable.

## 2015-11-08 NOTE — Discharge Instructions (Signed)
Follow with Burtis JunesBLOUNT,Eric VINCENT, MD in 5-7 days  Please get a complete blood count and chemistry panel checked by your Primary MD at your next visit, and again as instructed by your Primary MD. Please get your medications reviewed and adjusted by your Primary MD.  Please request your Primary MD to go over all Hospital Tests and Procedure/Radiological results at the follow up, please get all Hospital records sent to your Prim MD by signing hospital release before you go home.  If you had Pneumonia of Lung problems at the Hospital: Please get a 2 view Chest X ray done in 6-8 weeks after hospital discharge or sooner if instructed by your Primary MD.  If you have Congestive Heart Failure: Please call your Cardiologist or Primary MD anytime you have any of the following symptoms:  1) 3 pound weight gain in 24 hours or 5 pounds in 1 week  2) shortness of breath, with or without a dry hacking cough  3) swelling in the hands, feet or stomach  4) if you have to sleep on extra pillows at night in order to breathe  Follow cardiac low salt diet and 1.5 lit/day fluid restriction.  If you have diabetes Accuchecks 4 times/day, Once in AM empty stomach and then before each meal. Log in all results and show them to your primary doctor at your next visit. If any glucose reading is under 80 or above 300 call your primary MD immediately.  If you have Seizure/Convulsions/Epilepsy: Please do not drive, operate heavy machinery, participate in activities at heights or participate in high speed sports until you have seen by Primary MD or a Neurologist and advised to do so again.  If you had Gastrointestinal Bleeding: Please ask your Primary MD to check a complete blood count within one week of discharge or at your next visit. Your endoscopic/colonoscopic biopsies that are pending at the time of discharge, will also need to followed by your Primary MD.  Get Medicines reviewed and adjusted. Please take all your  medications with you for your next visit with your Primary MD  Please request your Primary MD to go over all hospital tests and procedure/radiological results at the follow up, please ask your Primary MD to get all Hospital records sent to his/her office.  If you experience worsening of your admission symptoms, develop shortness of breath, life threatening emergency, suicidal or homicidal thoughts you must seek medical attention immediately by calling 911 or calling your MD immediately  if symptoms less severe.  You must read complete instructions/literature along with all the possible adverse reactions/side effects for all the Medicines you take and that have been prescribed to you. Take any new Medicines after you have completely understood and accpet all the possible adverse reactions/side effects.   Do not drive or operate heavy machinery when taking Pain medications.   Do not take more than prescribed Pain, Sleep and Anxiety Medications  Special Instructions: If you have smoked or chewed Tobacco  in the last 2 yrs please stop smoking, stop any regular Alcohol  and or any Recreational drug use.  Wear Seat belts while driving.  Please note You were cared for by a hospitalist during your hospital stay. If you have any questions about your discharge medications or the care you received while you were in the hospital after you are discharged, you can call the unit and asked to speak with the hospitalist on call if the hospitalist that took care of you is not available. Once  you are discharged, your primary care physician will handle any further medical issues. Please note that NO REFILLS for any discharge medications will be authorized once you are discharged, as it is imperative that you return to your primary care physician (or establish a relationship with a primary care physician if you do not have one) for your aftercare needs so that they can reassess your need for medications and monitor your  lab values.  You can reach the hospitalist office at phone [phone] or fax [phone]   If you do not have a primary care physician, you can call [phone] for a physician referral.  Activity: As tolerated with Full fall precautions use walker/cane & assistance as needed  Disposition Home

## 2015-11-08 NOTE — Consult Note (Signed)
[facility] Consult   Reason for Consult:  Substance induced mood disorder Referring Physician:  Dr. Cruzita Lederer Patient Identification: Eric Chang MRN:  [id number] Principal Diagnosis: Polysubstance abuse Diagnosis:   Patient Active Problem List   Diagnosis Date Noted  . Acute encephalopathy [G93.40] 11/07/2015  . Overdose [T50.901A] 11/07/2015  . Polysubstance abuse [F19.10]   . Essential hypertension [I10]   . Tobacco abuse [Z72.0]     Total Time spent with patient: 1 hour  Subjective:   Eric Chang is a 45 y.o. male patient admitted with AMS  HPI:  Eric Chang is a 45 y.o. male seen, chart reviewed and case discussed with Dr. Letta Median for this face-to-face psychiatric consultation and evaluation of substance induced mood disorder. Patient reportedly has been abusing drugs like cocaine and heroin over 2 years. Patient reportedly abuses once a week and usually does not have any emotional or behavioral problems associated with it. Patient has been recently stressed about multiple psychosocial stresses including court date pending for violation of probation, been on streets with the crying episodes and also somebody found him making random noises. He has denied current symptoms of depression, anxiety, auditory/visual hallucinations, delusions and paranoia. Patient reportedly staying with his brother and sister in [place] and he has a daughter and son who lives by himself out of home. Patient also reportedly has a full-time work reportedly working for [employer]. Patient also reportedly has legal charges in the past for drug trafficking. Patient reported usually has been busy with full-time work and also issues with a drug trafficking. Patient presented with altered mental status which has been improved since admitted to the hospital. Patient was offered antidepressant medication, outpatient medication management and also substance abuse intensive  outpatient treatments. Patient reportedly did not like when he tried family service of [place] and does not want to go again any more. Patient also refuses other programs at this time. Patient may take Cymbalta 30 mg daily with the Food when he changes mind. Patient does not meet criteria for acute psychiatric hospitalization. Patient urine drug screen is positive for opioids and benzo has been some cocaine on arrival.  Past Psychiatric History: Patient has no previous acute psychiatric hospitalization but endorses history of substance abuse  Risk to Self:   Risk to Others:   Prior Inpatient Therapy:   Prior Outpatient Therapy:    Past Medical History:  Past Medical History  Diagnosis Date  . Hypertension   . UTI (lower urinary tract infection)   . Polysubstance abuse   . Tobacco abuse     Past Surgical History  Procedure Laterality Date  . Back surgery     Family History: History reviewed. No pertinent family history. Family Psychiatric  History: Denied Social History:  History  Alcohol Use No     History  Drug Use  . Yes  . Special: IV    Comment: Pt uses crack and Heroin    Social History   Social History  . Marital Status: Divorced    Spouse Name: N/A  . Number of Children: N/A  . Years of Education: N/A   Social History Main Topics  . Smoking status: Current Every Day Smoker -- 1.00 packs/day for 30 years    Types: Cigarettes  . Smokeless tobacco: Former Systems developer  . Alcohol Use: No  . Drug Use: Yes    Special: IV     Comment: Pt uses crack and Heroin  . Sexual Activity: Not  Asked   Other Topics Concern  . None   Social History Narrative   Additional Social History:    Allergies:  No Known Allergies  Labs:  Results for orders placed or performed during the hospital encounter of 11/07/15 (from the past 48 hour(s))  CBC     Status: Abnormal   Collection Time: 11/07/15  4:41 PM  Result Value Ref Range   WBC 8.8 4.0 - 10.5 K/uL   RBC 4.39 4.22 - 5.81  MIL/uL   Hemoglobin 12.4 (L) 13.0 - 17.0 g/dL   HCT 38.8 (L) 39.0 - 52.0 %   MCV 88.4 78.0 - 100.0 fL   MCH 28.2 26.0 - 34.0 pg   MCHC 32.0 30.0 - 36.0 g/dL   RDW 13.5 11.5 - 15.5 %   Platelets 241 150 - 400 K/uL  I-Stat Troponin, ED (not at [facility])     Status: None   Collection Time: 11/07/15  5:03 PM  Result Value Ref Range   Troponin i, poc 0.00 0.00 - 0.08 ng/mL   Comment 3            Comment: Due to the release kinetics of cTnI, a negative result within the first hours of the onset of symptoms does not rule out myocardial infarction with certainty. If myocardial infarction is still suspected, repeat the test at appropriate intervals.   I-stat chem 8, ed     Status: Abnormal   Collection Time: 11/07/15  5:05 PM  Result Value Ref Range   Sodium 141 135 - 145 mmol/L   Potassium 3.7 3.5 - 5.1 mmol/L   Chloride 102 101 - 111 mmol/L   BUN 10 6 - 20 mg/dL   Creatinine, Ser 0.70 0.61 - 1.24 mg/dL   Glucose, Bld 80 65 - 99 mg/dL   Calcium, Ion 1.07 (L) 1.12 - 1.23 mmol/L   TCO2 26 0 - 100 mmol/L   Hemoglobin 14.3 13.0 - 17.0 g/dL   HCT 42.0 39.0 - 52.0 %  Ethanol     Status: None   Collection Time: 11/07/15  9:39 PM  Result Value Ref Range   Alcohol, Ethyl (B) <5 <5 mg/dL    Comment:        LOWEST DETECTABLE LIMIT FOR SERUM ALCOHOL IS 5 mg/dL FOR MEDICAL PURPOSES ONLY   Troponin I     Status: None   Collection Time: 11/07/15  9:39 PM  Result Value Ref Range   Troponin I <0.03 <0.031 ng/mL    Comment:        NO INDICATION OF MYOCARDIAL INJURY.   Salicylate level     Status: None   Collection Time: 11/07/15 10:48 PM  Result Value Ref Range   Salicylate Lvl <0.9 2.8 - 30.0 mg/dL  Acetaminophen level     Status: Abnormal   Collection Time: 11/07/15 10:48 PM  Result Value Ref Range   Acetaminophen (Tylenol), Serum <10 (L) 10 - 30 ug/mL    Comment:        THERAPEUTIC CONCENTRATIONS VARY SIGNIFICANTLY. A RANGE OF 10-30 ug/mL MAY BE AN EFFECTIVE CONCENTRATION FOR MANY  PATIENTS. HOWEVER, SOME ARE BEST TREATED AT CONCENTRATIONS OUTSIDE THIS RANGE. ACETAMINOPHEN CONCENTRATIONS >150 ug/mL AT 4 HOURS AFTER INGESTION AND >50 ug/mL AT 12 HOURS AFTER INGESTION ARE OFTEN ASSOCIATED WITH TOXIC REACTIONS.   Troponin I (q 6hr x 3)     Status: None   Collection Time: 11/08/15  1:12 AM  Result Value Ref Range   Troponin I <0.03 <0.031  ng/mL    Comment:        NO INDICATION OF MYOCARDIAL INJURY.   Urine rapid drug screen (hosp performed)     Status: Abnormal   Collection Time: 11/08/15  6:12 AM  Result Value Ref Range   Opiates POSITIVE (A) NONE DETECTED   Cocaine POSITIVE (A) NONE DETECTED   Benzodiazepines POSITIVE (A) NONE DETECTED   Amphetamines NONE DETECTED NONE DETECTED   Tetrahydrocannabinol NONE DETECTED NONE DETECTED   Barbiturates NONE DETECTED NONE DETECTED    Comment:        DRUG SCREEN FOR MEDICAL PURPOSES ONLY.  IF CONFIRMATION IS NEEDED FOR ANY PURPOSE, NOTIFY LAB WITHIN 5 DAYS.        LOWEST DETECTABLE LIMITS FOR URINE DRUG SCREEN Drug Class       Cutoff (ng/mL) Amphetamine      1000 Barbiturate      200 Benzodiazepine   630 Tricyclics       160 Opiates          300 Cocaine          300 THC              50   Comprehensive metabolic panel     Status: Abnormal   Collection Time: 11/08/15  6:12 AM  Result Value Ref Range   Sodium 140 135 - 145 mmol/L   Potassium 4.0 3.5 - 5.1 mmol/L   Chloride 104 101 - 111 mmol/L   CO2 22 22 - 32 mmol/L   Glucose, Bld 65 65 - 99 mg/dL   BUN 7 6 - 20 mg/dL   Creatinine, Ser 0.78 0.61 - 1.24 mg/dL   Calcium 8.7 (L) 8.9 - 10.3 mg/dL   Total Protein 6.3 (L) 6.5 - 8.1 g/dL   Albumin 3.1 (L) 3.5 - 5.0 g/dL   AST 42 (H) 15 - 41 U/L   ALT 26 17 - 63 U/L   Alkaline Phosphatase 79 38 - 126 U/L   Total Bilirubin 1.1 0.3 - 1.2 mg/dL   GFR calc non Af Amer >60 >60 mL/min   GFR calc Af Amer >60 >60 mL/min    Comment: (NOTE) The eGFR has been calculated using the CKD EPI equation. This  calculation has not been validated in all clinical situations. eGFR's persistently <60 mL/min signify possible Chronic Kidney Disease.    Anion gap 14 5 - 15  CBC     Status: None   Collection Time: 11/08/15  6:12 AM  Result Value Ref Range   WBC 7.7 4.0 - 10.5 K/uL   RBC 4.83 4.22 - 5.81 MIL/uL   Hemoglobin 13.6 13.0 - 17.0 g/dL   HCT 43.1 39.0 - 52.0 %   MCV 89.2 78.0 - 100.0 fL   MCH 28.2 26.0 - 34.0 pg   MCHC 31.6 30.0 - 36.0 g/dL   RDW 13.8 11.5 - 15.5 %   Platelets 239 150 - 400 K/uL  Troponin I (q 6hr x 3)     Status: None   Collection Time: 11/08/15  6:12 AM  Result Value Ref Range   Troponin I <0.03 <0.031 ng/mL    Comment:        NO INDICATION OF MYOCARDIAL INJURY.   Glucose, capillary     Status: Abnormal   Collection Time: 11/08/15  7:54 AM  Result Value Ref Range   Glucose-Capillary 59 (L) 65 - 99 mg/dL    Current Facility-Administered Medications  Medication Dose Route Frequency Provider Last Rate Last  Dose  . 0.9 %  sodium chloride infusion   Intravenous Continuous Ivor Costa, MD 125 mL/hr at 11/08/15 [phone]    . acetaminophen (TYLENOL) tablet 650 mg  650 mg Oral Q6H PRN Ivor Costa, MD       Or  . acetaminophen (TYLENOL) suppository 650 mg  650 mg Rectal Q6H PRN Ivor Costa, MD      . enoxaparin (LOVENOX) injection 40 mg  40 mg Subcutaneous Q24H Ivor Costa, MD      . hydrALAZINE (APRESOLINE) injection 5 mg  5 mg Intravenous Q2H PRN Ivor Costa, MD      . ketorolac (TORADOL) 30 MG/ML injection 30 mg  30 mg Intravenous Once Jeryl Columbia, NP      . LORazepam (ATIVAN) injection 1 mg  1 mg Intravenous Q4H PRN Ivor Costa, MD      . nicotine (NICODERM CQ - dosed in mg/24 hours) patch 21 mg  21 mg Transdermal Daily Ivor Costa, MD      . ondansetron [facility]) injection 4 mg  4 mg Intravenous Q8H PRN Ivor Costa, MD      . oxyCODONE (Oxy IR/ROXICODONE) immediate release tablet 5 mg  5 mg Oral Q4H PRN Costin Karlyne Greenspan, MD      . sodium chloride flush (NS) 0.9 % injection 3 mL   3 mL Intravenous Q12H Ivor Costa, MD   3 mL at 11/07/15 2302    Musculoskeletal: Strength & Muscle Tone: within normal limits Gait & Station: normal Patient leans: N/A  Psychiatric Specialty Exam: ROS  No Fever-chills, No Headache, No changes with Vision or hearing, reports vertigo No problems swallowing food or Liquids, No Chest pain, Cough or Shortness of Breath, No Abdominal pain, No Nausea or Vommitting, Bowel movements are regular, No Blood in stool or Urine, No dysuria, No new skin rashes or bruises, No new joints pains-aches,  No new weakness, tingling, numbness in any extremity, No recent weight gain or loss, No polyuria, polydypsia or polyphagia,   A full 10 point Review of Systems was done, except as stated above, all other Review of Systems were negative.  Blood pressure 113/63, pulse 72, temperature 97.9 F (36.6 C), temperature source Oral, resp. rate 18, height '6\' 3"'  (1.905 m), weight 119.886 kg (264 lb 4.8 oz), SpO2 100 %.Body mass index is 33.04 kg/(m^2).  General Appearance: Casual  Eye Contact::  Good  Speech:  Clear and Coherent  Volume:  Normal  Mood:  Depressed  Affect:  Constricted and Depressed  Thought Process:  Coherent and Goal Directed  Orientation:  Full (Time, Place, and Person)  Thought Content:  WDL  Suicidal Thoughts:  No  Homicidal Thoughts:  No  Memory:  Immediate;   Good Recent;   Good Remote;   Good  Judgement:  Impaired  Insight:  Fair  Psychomotor Activity:  Normal  Concentration:  Good  Recall:  Good  Fund of Knowledge:Good  Language: Good  Akathisia:  Negative  Handed:  Right  AIMS (if indicated):     Assets:  Communication Skills Desire for Improvement Financial Resources/Insurance Housing Leisure Time Physical Health Resilience Social Support Talents/Skills Transportation Vocational/Educational  ADL's:  Intact  Cognition: WNL  Sleep:      Treatment Plan Summary: Patient has been suffering with chronic  recurrent polysubstance abuse and presented with a substance induced mood disorder including crying episodes and denied active suicidal/homicidal ideation, intention or plans. Patient has no evidence of psychosis. Patient has no safety concerns. Patient refuses treatment  for depression and also for substance abuse Patient is concerned about his court date tomorrow and probably may lock him up Patient wanted to think about going to the substance abuse treatment only after tomorrow  Patient refused depression medication treatment and would suggest Cymbalta 30 mg daily if he changes mind. May also provide information regarding substance abuse intensive outpatient treatment/rehabilitation Appreciate psychiatric consultation and we sign off as of today Please contact [phone] or [phone] if needs further assistance   Disposition: Patient does not meet criteria for psychiatric inpatient admission. Supportive therapy provided about ongoing stressors.  Durward Parcel., MD 11/08/2015 10:57 AM

## 2015-11-13 ENCOUNTER — Emergency Department (HOSPITAL_COMMUNITY)
Admission: EM | Admit: 2015-11-13 | Discharge: 2015-11-13 | Disposition: A | Discharge status: Home / Self Care | Payer: Self-pay | Attending: Emergency Medicine | Admitting: Emergency Medicine

## 2015-11-13 ENCOUNTER — Emergency Department (HOSPITAL_COMMUNITY): Payer: Self-pay

## 2015-11-13 ENCOUNTER — Encounter (HOSPITAL_COMMUNITY): Payer: Self-pay | Admitting: Emergency Medicine

## 2015-11-13 DIAGNOSIS — F1310 Sedative, hypnotic or anxiolytic abuse, uncomplicated: Secondary | ICD-10-CM | POA: Insufficient documentation

## 2015-11-13 DIAGNOSIS — I10 Essential (primary) hypertension: Secondary | ICD-10-CM | POA: Insufficient documentation

## 2015-11-13 DIAGNOSIS — Z87440 Personal history of urinary (tract) infections: Secondary | ICD-10-CM | POA: Insufficient documentation

## 2015-11-13 DIAGNOSIS — R4182 Altered mental status, unspecified: Secondary | ICD-10-CM | POA: Insufficient documentation

## 2015-11-13 DIAGNOSIS — F17210 Nicotine dependence, cigarettes, uncomplicated: Secondary | ICD-10-CM | POA: Insufficient documentation

## 2015-11-13 DIAGNOSIS — F1110 Opioid abuse, uncomplicated: Secondary | ICD-10-CM | POA: Insufficient documentation

## 2015-11-13 DIAGNOSIS — F1410 Cocaine abuse, uncomplicated: Secondary | ICD-10-CM | POA: Insufficient documentation

## 2015-11-13 LAB — COMPREHENSIVE METABOLIC PANEL
ALBUMIN: 3.8 g/dL (ref 3.5–5.0)
ALK PHOS: 78 U/L (ref 38–126)
ALT: 26 U/L (ref 17–63)
ANION GAP: 8 (ref 5–15)
AST: 37 U/L (ref 15–41)
BILIRUBIN TOTAL: 0.5 mg/dL (ref 0.3–1.2)
BUN: 13 mg/dL (ref 6–20)
CALCIUM: 8.8 mg/dL — AB (ref 8.9–10.3)
CO2: 27 mmol/L (ref 22–32)
CREATININE: 0.88 mg/dL (ref 0.61–1.24)
Chloride: 104 mmol/L (ref 101–111)
GFR calc Af Amer: >60 mL/min (ref >60)
GFR calc non Af Amer: >60 mL/min (ref >60)
GLUCOSE: 94 mg/dL (ref 65–99)
Potassium: 4.1 mmol/L (ref 3.5–5.1)
Sodium: 139 mmol/L (ref 135–145)
TOTAL PROTEIN: 7.2 g/dL (ref 6.5–8.1)

## 2015-11-13 LAB — URINALYSIS, ROUTINE W REFLEX MICROSCOPIC
BILIRUBIN URINE: NEGATIVE
Glucose, UA: NEGATIVE mg/dL
Hgb urine dipstick: NEGATIVE
KETONES UR: NEGATIVE mg/dL
Leukocytes, UA: NEGATIVE
NITRITE: POSITIVE — AB
PROTEIN: NEGATIVE mg/dL
SPECIFIC GRAVITY, URINE: 1.022 (ref 1.005–1.030)
pH: 5 (ref 5.0–8.0)

## 2015-11-13 LAB — CBC WITH DIFFERENTIAL/PLATELET
BASOS PCT: 1 %
Basophils Absolute: 0.1 10*3/uL (ref 0.0–0.1)
Eosinophils Absolute: 0.4 10*3/uL (ref 0.0–0.7)
Eosinophils Relative: 5 %
HEMATOCRIT: 38.5 % — AB (ref 39.0–52.0)
HEMOGLOBIN: 12.6 g/dL — AB (ref 13.0–17.0)
LYMPHS ABS: 3.4 10*3/uL (ref 0.7–4.0)
Lymphocytes Relative: 41 %
MCH: 28.8 pg (ref 26.0–34.0)
MCHC: 32.7 g/dL (ref 30.0–36.0)
MCV: 88.1 fL (ref 78.0–100.0)
MONOS PCT: 8 %
Monocytes Absolute: 0.7 10*3/uL (ref 0.1–1.0)
NEUTROS ABS: 3.8 10*3/uL (ref 1.7–7.7)
NEUTROS PCT: 45 %
Platelets: 319 10*3/uL (ref 150–400)
RBC: 4.37 MIL/uL (ref 4.22–5.81)
RDW: 13.6 % (ref 11.5–15.5)
WBC: 8.3 10*3/uL (ref 4.0–10.5)

## 2015-11-13 LAB — RAPID URINE DRUG SCREEN, HOSP PERFORMED
Amphetamines: NOT DETECTED
BENZODIAZEPINES: POSITIVE — AB
Barbiturates: NOT DETECTED
Cocaine: POSITIVE — AB
Opiates: POSITIVE — AB
Tetrahydrocannabinol: NOT DETECTED

## 2015-11-13 LAB — URINE MICROSCOPIC-ADD ON: RBC / HPF: NONE SEEN RBC/hpf (ref 0–5)

## 2015-11-13 LAB — I-STAT TROPONIN, ED: TROPONIN I, POC: 0 ng/mL (ref 0.00–0.08)

## 2015-11-13 LAB — ETHANOL

## 2015-11-13 MED ORDER — SODIUM CHLORIDE 0.9 % IV BOLUS (SEPSIS)
500.0000 mL | Freq: Once | INTRAVENOUS | Status: AC
  Administered 2015-11-13: 500 mL via INTRAVENOUS

## 2015-11-13 NOTE — ED Notes (Signed)
Pt requesting to leave; [facility] notified.

## 2015-11-13 NOTE — Discharge Instructions (Signed)
Follow up with a family md later this week for recheck

## 2015-11-13 NOTE — ED Notes (Addendum)
[place] [facility] [place] [address]. Staff called EMS d/t pt behaving bizarrely in hallways, clapping hands, hitting walls, erratic movements.    Pt admits to crack and heroin usage last night, pt would also like abscess to L cheek assessed.

## 2015-11-13 NOTE — ED Notes (Signed)
Upon entering room pt seen standing by bed with pants half down; pt assisted into bed and give warm blanket related to pt request to rest.

## 2015-11-13 NOTE — ED Provider Notes (Signed)
CSN: [id number]     Arrival date & time 11/13/15  4098 History   First MD Initiated Contact with Patient 11/13/15 0700     Chief Complaint  Patient presents with  . Bizarre behavior      (Consider location/radiation/quality/duration/timing/severity/associated sxs/prior Treatment) Patient is a 45 y.o. male presenting with altered mental status. The history is provided by the EMS personnel (Patient brought in for acting unusual and hotel. Patient was yelling and confused when brought in by EMS).  Altered Mental Status Presenting symptoms: behavior changes   Severity:  Moderate Most recent episode:  Today Episode history:  Multiple Timing:  Constant Progression:  Waxing and waning   Past Medical History  Diagnosis Date  . Hypertension   . UTI (lower urinary tract infection)   . Polysubstance abuse   . Tobacco abuse    Past Surgical History  Procedure Laterality Date  . Back surgery     No family history on file. Social History  Substance Use Topics  . Smoking status: Current Every Day Smoker -- 1.00 packs/day for 30 years    Types: Cigarettes  . Smokeless tobacco: Former Neurosurgeon  . Alcohol Use: No    Review of Systems  Unable to perform ROS: Mental status change      Allergies  Review of patient's allergies indicates no known allergies.  Home Medications   Prior to Admission medications   Not on File   BP 141/99 mmHg  Pulse 88  Temp(Src) 98.4 F (36.9 C) (Oral)  Resp 18  SpO2 94% Physical Exam  Constitutional: He appears well-developed.  HENT:  Head: Normocephalic.  Eyes: Conjunctivae and EOM are normal. No scleral icterus.  Neck: Neck supple. No thyromegaly present.  Cardiovascular: Normal rate and regular rhythm.  Exam reveals no gallop and no friction rub.   No murmur heard. Pulmonary/Chest: No stridor. He has no wheezes. He has no rales. He exhibits no tenderness.  Abdominal: He exhibits no distension. There is no tenderness. There is no rebound.   Musculoskeletal: Normal range of motion. He exhibits no edema.  Lymphadenopathy:    He has no cervical adenopathy.  Neurological: He exhibits normal muscle tone. Coordination normal.  Patient oriented to person and place but lethargic  Skin: No rash noted. No erythema.  Psychiatric: He has a normal mood and affect. His behavior is normal.    ED Course  Procedures (including critical care time) Labs Review Labs Reviewed  CBC WITH DIFFERENTIAL/PLATELET - Abnormal; Notable for the following:    Hemoglobin 12.6 (*)    HCT 38.5 (*)    All other components within normal limits  COMPREHENSIVE METABOLIC PANEL - Abnormal; Notable for the following:    Calcium 8.8 (*)    All other components within normal limits  ETHANOL  URINE RAPID DRUG SCREEN, HOSP PERFORMED  URINALYSIS, ROUTINE W REFLEX MICROSCOPIC (NOT AT [facility])  I-STAT TROPOININ, ED    Imaging Review Ct Head Wo Contrast  11/13/2015  CLINICAL DATA:  Altered mental status, bizarre behavior, erratic movements, confusion. EXAM: CT HEAD WITHOUT CONTRAST TECHNIQUE: Contiguous axial images were obtained from the base of the skull through the vertex without intravenous contrast. COMPARISON:  Head CT dated 11/07/2015. FINDINGS: Brain: Ventricles remain normal in size and configuration. All areas of the brain demonstrate normal gray-white matter attenuation. There is no mass, hemorrhage, edema or other evidence of acute parenchymal abnormality. No extra-axial hemorrhage. Vascular: No hyperdense vessel or unexpected calcification. Skull: Negative for fracture or focal lesion. Sinuses/Orbits: No  acute findings. Other: None. IMPRESSION: Normal head CT. Electronically Signed   By: Bary RichardStan  Maynard M.D.   On: 11/13/2015 10:20   Dg Chest Port 1 View  11/13/2015  CLINICAL DATA:  Chest pain. EXAM: PORTABLE CHEST 1 VIEW COMPARISON:  11/07/2015. FINDINGS: Mediastinum and hilar structures are unremarkable. Borderline cardiomegaly. Low lung volumes with mild  bibasilar atelectasis. No pleural effusion. No pneumothorax. No acute osseus abnormality. IMPRESSION: Over 1 Borderline cardiomegaly. 2.  Low lung volumes.  Mild basilar atelectasis. Electronically Signed   By: Maisie Fushomas  Register   On: 11/13/2015 08:02   I have personally reviewed and evaluated these images and lab results as part of my medical decision-making.   EKG Interpretation None      MDM   Final diagnoses:  Altered mental status, unspecified altered mental status type    After patient had been here for 5 hours she was awake and oriented 4. He stated that he wanted to go home because he had business to take care of. His urine test had not been finished. I suspect the patient has substance abuse problems today he will be allowed to be discharged home and will follow-up as needed he is not homicidal suicidal or having hallucinations now    Bethann BerkshireJoseph Brailen Macneal, MD 11/13/15 1142

## 2016-06-14 ENCOUNTER — Encounter (HOSPITAL_COMMUNITY): Payer: Self-pay | Admitting: Emergency Medicine

## 2016-06-14 ENCOUNTER — Emergency Department (HOSPITAL_COMMUNITY): Payer: No Typology Code available for payment source

## 2016-06-14 ENCOUNTER — Emergency Department (HOSPITAL_COMMUNITY)
Admission: EM | Admit: 2016-06-14 | Discharge: 2016-06-14 | Disposition: A | Discharge status: Home / Self Care | Payer: No Typology Code available for payment source | Attending: Dermatology | Admitting: Dermatology

## 2016-06-14 DIAGNOSIS — Y92410 Unspecified street and highway as the place of occurrence of the external cause: Secondary | ICD-10-CM | POA: Insufficient documentation

## 2016-06-14 DIAGNOSIS — F17210 Nicotine dependence, cigarettes, uncomplicated: Secondary | ICD-10-CM | POA: Diagnosis not present

## 2016-06-14 DIAGNOSIS — Y9389 Activity, other specified: Secondary | ICD-10-CM | POA: Insufficient documentation

## 2016-06-14 DIAGNOSIS — Y999 Unspecified external cause status: Secondary | ICD-10-CM | POA: Insufficient documentation

## 2016-06-14 DIAGNOSIS — S4991XA Unspecified injury of right shoulder and upper arm, initial encounter: Secondary | ICD-10-CM | POA: Insufficient documentation

## 2016-06-14 DIAGNOSIS — I10 Essential (primary) hypertension: Secondary | ICD-10-CM | POA: Diagnosis not present

## 2016-06-14 DIAGNOSIS — Z5321 Procedure and treatment not carried out due to patient leaving prior to being seen by health care provider: Secondary | ICD-10-CM | POA: Diagnosis not present

## 2016-06-14 NOTE — ED Notes (Signed)
Called x3. No answer 
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
## 2016-06-14 NOTE — ED Notes (Signed)
Called x 1 for a room.  No answer. Will attempt again.

## 2016-06-14 NOTE — ED Notes (Signed)
Called x 2. No answer 

## 2018-07-05 ENCOUNTER — Emergency Department (HOSPITAL_COMMUNITY)
Admission: EM | Admit: 2018-07-05 | Discharge: 2018-07-05 | Disposition: A | Discharge status: Home / Self Care | Payer: Self-pay | Attending: Emergency Medicine | Admitting: Emergency Medicine

## 2018-07-05 ENCOUNTER — Encounter (HOSPITAL_COMMUNITY): Payer: Self-pay

## 2018-07-05 DIAGNOSIS — F17210 Nicotine dependence, cigarettes, uncomplicated: Secondary | ICD-10-CM | POA: Insufficient documentation

## 2018-07-05 DIAGNOSIS — S8391XA Sprain of unspecified site of right knee, initial encounter: Secondary | ICD-10-CM | POA: Insufficient documentation

## 2018-07-05 DIAGNOSIS — X501XXA Overexertion from prolonged static or awkward postures, initial encounter: Secondary | ICD-10-CM | POA: Insufficient documentation

## 2018-07-05 DIAGNOSIS — Y990 Civilian activity done for income or pay: Secondary | ICD-10-CM | POA: Insufficient documentation

## 2018-07-05 DIAGNOSIS — Y939 Activity, unspecified: Secondary | ICD-10-CM | POA: Insufficient documentation

## 2018-07-05 DIAGNOSIS — Y929 Unspecified place or not applicable: Secondary | ICD-10-CM | POA: Insufficient documentation

## 2018-07-05 DIAGNOSIS — M25461 Effusion, right knee: Secondary | ICD-10-CM | POA: Insufficient documentation

## 2018-07-05 DIAGNOSIS — I10 Essential (primary) hypertension: Secondary | ICD-10-CM | POA: Insufficient documentation

## 2018-07-05 MED ORDER — NAPROXEN 500 MG PO TABS: 500.0000 mg | ORAL_TABLET | Freq: Two times a day (BID) | ORAL | 0 refills | Status: DC

## 2018-07-05 MED ORDER — NAPROXEN 250 MG PO TABS
500.0000 mg | ORAL_TABLET | Freq: Once | ORAL | Status: DC
  Filled 2018-07-05: qty 2

## 2018-07-05 NOTE — ED Provider Notes (Signed)
[facility] EMERGENCY DEPARTMENT Provider Note   CSN: [id number] Arrival date & time: 07/05/18  2116     History   Chief Complaint Chief Complaint  Patient presents with  . Knee Pain    HPI Eric Chang is a 47 y.o. male.  47 year old male presents with complaint of pain in his right knee.  Patient states that he was pulling carpet at work the other day and twisted his knee and developed pain in his knee at that time.  Patient then states that while walking up the stairs he felt a pop behind his knee and has had ongoing pain since reports slight swelling in the knee.  Pain is worse with movement and weightbearing.  Denies falls or traumatic injury.  No prior problems with this knee.  No other complaints or concerns.     Past Medical History:  Diagnosis Date  . Hypertension   . Polysubstance abuse (HCC)   . Tobacco abuse   . UTI (lower urinary tract infection)     Patient Active Problem List   Diagnosis Date Noted  . Acute encephalopathy 11/07/2015  . Overdose 11/07/2015  . Polysubstance abuse (HCC)   . Essential hypertension   . Tobacco abuse     Past Surgical History:  Procedure Laterality Date  . BACK SURGERY          Home Medications    Prior to Admission medications   Medication Sig Start Date End Date Taking? Authorizing Provider  naproxen (NAPROSYN) 500 MG tablet Take 1 tablet (500 mg total) by mouth 2 (two) times daily. 07/05/18   Jeannie Fend, PA-C    Family History History reviewed. No pertinent family history.  Social History Social History   Tobacco Use  . Smoking status: Current Every Day Smoker    Packs/day: 1.00    Years: 30.00    Pack years: 30.00    Types: Cigarettes  . Smokeless tobacco: Former Engineer, water Use Topics  . Alcohol use: No  . Drug use: No     Allergies   Patient has no known allergies.   Review of Systems Review of Systems  Constitutional: Negative for chills and fever.    Musculoskeletal: Positive for arthralgias, joint swelling and myalgias.  Skin: Negative for color change, rash and wound.  Allergic/Immunologic: Negative for immunocompromised state.  Neurological: Negative for weakness and numbness.  Hematological: Does not bruise/bleed easily.  Psychiatric/Behavioral: Negative for self-injury.  All other systems reviewed and are negative.    Physical Exam Updated Vital Signs BP (!) 141/89 (BP Location: Right Arm)   Pulse 84   Temp 98 F (36.7 C) (Oral)   Resp 16   SpO2 99%   Physical Exam  Constitutional: He is oriented to person, place, and time. He appears well-developed and well-nourished. No distress.  HENT:  Head: Normocephalic and atraumatic.  Cardiovascular: Intact distal pulses.  Pulmonary/Chest: Effort normal.  Musculoskeletal: He exhibits tenderness. He exhibits no deformity.       Right hip: Normal.       Right knee: He exhibits effusion. He exhibits normal range of motion, no ecchymosis, no deformity, no laceration, no erythema, normal alignment, no LCL laxity, no bony tenderness and no MCL laxity. Tenderness found. Lateral joint line tenderness noted. No patellar tendon tenderness noted.       Right ankle: Normal.       Legs: Neurological: He is alert and oriented to person, place, and time. No sensory deficit.  Skin: Skin is warm and dry. He is not diaphoretic. No erythema.  Psychiatric: He has a normal mood and affect. His behavior is normal.  Nursing note and vitals reviewed.    ED Treatments / Results  Labs (all labs ordered are listed, but only abnormal results are displayed) Labs Reviewed - No data to display  EKG None  Radiology No results found.  Procedures Procedures (including critical care time)  Medications Ordered in ED Medications  naproxen (NAPROSYN) tablet 500 mg (500 mg Oral Refused 07/05/18 2143)     Initial Impression / Assessment and Plan / ED Course  I have reviewed the triage vital signs  and the nursing notes.  Pertinent labs & imaging results that were available during my care of the patient were reviewed by me and considered in my medical decision making (see chart for details).  Clinical Course as of Jul 05 2157  Wynelle LinkSun Jul 05, 2018  [id number] 47 year old male with complaint of right knee pain for the past few days without traumatic injury.  On exam patient has right lateral joint line tenderness with a small effusion.  There is no erythema or evidence of septic joint.  His calf is nontender, no swelling, no palpable cords.  Suspect osteoarthritis versus knee sprain.  Patient given elastic sleeve and prescription for naproxen with referral to orthopedics or see PCP.   [LM]    Clinical Course User Index [LM] Jeannie FendMurphy,  A, PA-C   Final Clinical Impressions(s) / ED Diagnoses   Final diagnoses:  Sprain of right knee, unspecified ligament, initial encounter  Effusion of right knee    ED Discharge Orders         Ordered    naproxen (NAPROSYN) 500 MG tablet  2 times daily     07/05/18 2132           Jeannie FendMurphy,  A, PA-C 07/05/18 2158    Benjiman CorePickering, Nathan, MD 07/06/18 0001

## 2018-07-05 NOTE — ED Triage Notes (Signed)
Right knee pain x1 week

## 2018-07-05 NOTE — Discharge Instructions (Addendum)
Wear knee sleeve as needed for pain relief. Take naproxen as prescribed for pain. Did apply heat to knee for 20 minutes at a time. Follow-up with orthopedics or see your PCP.

## 2018-09-27 ENCOUNTER — Emergency Department (HOSPITAL_COMMUNITY): Payer: Self-pay

## 2018-09-27 ENCOUNTER — Encounter (HOSPITAL_COMMUNITY): Payer: Self-pay

## 2018-09-27 ENCOUNTER — Emergency Department (HOSPITAL_COMMUNITY)
Admission: EM | Admit: 2018-09-27 | Discharge: 2018-09-27 | Disposition: A | Discharge status: Home / Self Care | Payer: Self-pay | Attending: Emergency Medicine | Admitting: Emergency Medicine

## 2018-09-27 DIAGNOSIS — F17210 Nicotine dependence, cigarettes, uncomplicated: Secondary | ICD-10-CM | POA: Insufficient documentation

## 2018-09-27 DIAGNOSIS — R1084 Generalized abdominal pain: Secondary | ICD-10-CM | POA: Insufficient documentation

## 2018-09-27 DIAGNOSIS — M545 Low back pain, unspecified: Secondary | ICD-10-CM

## 2018-09-27 DIAGNOSIS — I10 Essential (primary) hypertension: Secondary | ICD-10-CM | POA: Insufficient documentation

## 2018-09-27 LAB — CBC WITH DIFFERENTIAL/PLATELET
Abs Immature Granulocytes: 0.02 10*3/uL (ref 0.00–0.07)
Basophils Absolute: 0.1 10*3/uL (ref 0.0–0.1)
Basophils Relative: 1 %
Eosinophils Absolute: 0.2 10*3/uL (ref 0.0–0.5)
Eosinophils Relative: 3 %
HEMATOCRIT: 40.6 % (ref 39.0–52.0)
Hemoglobin: 12.3 g/dL — ABNORMAL LOW (ref 13.0–17.0)
Immature Granulocytes: 0 %
LYMPHS ABS: 2 10*3/uL (ref 0.7–4.0)
Lymphocytes Relative: 34 %
MCH: 27.3 pg (ref 26.0–34.0)
MCHC: 30.3 g/dL (ref 30.0–36.0)
MCV: 90 fL (ref 80.0–100.0)
Monocytes Absolute: 0.6 10*3/uL (ref 0.1–1.0)
Monocytes Relative: 10 %
Neutro Abs: 3 10*3/uL (ref 1.7–7.7)
Neutrophils Relative %: 52 %
Platelets: 194 10*3/uL (ref 150–400)
RBC: 4.51 MIL/uL (ref 4.22–5.81)
RDW: 14.2 % (ref 11.5–15.5)
WBC: 5.7 10*3/uL (ref 4.0–10.5)
nRBC: 0 % (ref 0.0–0.2)

## 2018-09-27 LAB — URINALYSIS, ROUTINE W REFLEX MICROSCOPIC
Bilirubin Urine: NEGATIVE
Glucose, UA: NEGATIVE mg/dL
Hgb urine dipstick: NEGATIVE
Ketones, ur: NEGATIVE mg/dL
Nitrite: POSITIVE — AB
Protein, ur: NEGATIVE mg/dL
Specific Gravity, Urine: 1.02 (ref 1.005–1.030)
pH: 6 (ref 5.0–8.0)

## 2018-09-27 LAB — COMPREHENSIVE METABOLIC PANEL
ALT: 25 U/L (ref 0–44)
AST: 26 U/L (ref 15–41)
Albumin: 3.2 g/dL — ABNORMAL LOW (ref 3.5–5.0)
Alkaline Phosphatase: 110 U/L (ref 38–126)
Anion gap: 9 (ref 5–15)
BUN: 11 mg/dL (ref 6–20)
CO2: 23 mmol/L (ref 22–32)
Calcium: 8.8 mg/dL — ABNORMAL LOW (ref 8.9–10.3)
Chloride: 107 mmol/L (ref 98–111)
Creatinine, Ser: 0.84 mg/dL (ref 0.61–1.24)
Glucose, Bld: 134 mg/dL — ABNORMAL HIGH (ref 70–99)
Potassium: 4.1 mmol/L (ref 3.5–5.1)
Sodium: 139 mmol/L (ref 135–145)
TOTAL PROTEIN: 7 g/dL (ref 6.5–8.1)
Total Bilirubin: 0.5 mg/dL (ref 0.3–1.2)

## 2018-09-27 MED ORDER — CEPHALEXIN 500 MG PO CAPS: 500.0000 mg | ORAL_CAPSULE | Freq: Four times a day (QID) | ORAL | 0 refills | Status: DC

## 2018-09-27 MED ORDER — KETOROLAC TROMETHAMINE 30 MG/ML IJ SOLN
30.0000 mg | Freq: Once | INTRAMUSCULAR | Status: AC
  Administered 2018-09-27: 30 mg via INTRAVENOUS
  Filled 2018-09-27: qty 1

## 2018-09-27 MED ORDER — METHOCARBAMOL 500 MG PO TABS: 500.0000 mg | ORAL_TABLET | Freq: Every evening | ORAL | 0 refills | Status: DC | PRN

## 2018-09-27 MED ORDER — SODIUM CHLORIDE 0.9 % IV SOLN
1.0000 g | Freq: Once | INTRAVENOUS | Status: AC
  Administered 2018-09-27: 1 g via INTRAVENOUS
  Filled 2018-09-27: qty 10

## 2018-09-27 MED ORDER — NAPROXEN 500 MG PO TABS: 500.0000 mg | ORAL_TABLET | Freq: Two times a day (BID) | ORAL | 0 refills | Status: DC

## 2018-09-27 MED ORDER — IOHEXOL 300 MG/ML  SOLN
100.0000 mL | Freq: Once | INTRAMUSCULAR | Status: AC | PRN
  Administered 2018-09-27: 100 mL via INTRAVENOUS

## 2018-09-27 NOTE — ED Provider Notes (Signed)
[facility] EMERGENCY DEPARTMENT Provider Note   CSN: [id number] Arrival date & time: 09/27/18  1349     History   Chief Complaint Chief Complaint  Patient presents with  . Back Pain    HPI Eric Chang is a 48 y.o. male for evaluation of back pain.  Patient states that the past 3 weeks, he has had gradually worsening back pain.  Back pain is mostly on his left side, radiates into his leg.  It is worse when he is laying flat and trying to move.  He has taken a BC powder with mild improvement of his symptoms, has not tried anything else.  He reports a history of lumbar surgeries, but states this feels different.  His last lumbar surgery was in 2008 with Dr. Dutch Quint.  He has not followed up with him since.  He denies fevers, chills, rash, nausea, vomiting, anterior abdominal pain, urinary symptoms, normal bowel movements, loss of bowel bladder control, numbness, or tingling.  He denies history of cancer.  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  Patient states he has no other medical problems, takes no medications daily. Pt states pain is further inside, denies worsened pain with palpation.   Additional history obtained per chart review, patient with a history of hypertension, polysubstance abuse, and previous UTI.  HPI  Past Medical History:  Diagnosis Date  . Hypertension   . Polysubstance abuse (HCC)   . Tobacco abuse   . UTI (lower urinary tract infection)     Patient Active Problem List   Diagnosis Date Noted  . Acute encephalopathy 11/07/2015  . Overdose 11/07/2015  . Polysubstance abuse (HCC)   . Essential hypertension   . Tobacco abuse     Past Surgical History:  Procedure Laterality Date  . BACK SURGERY          Home Medications    Prior to Admission medications   Medication Sig Start Date End Date Taking? Authorizing Provider  naproxen (NAPROSYN) 500 MG tablet Take 1 tablet (500 mg total) by mouth  2 (two) times daily. 07/05/18   Jeannie Fend, PA-C    Family History No family history on file.  Social History Social History   Tobacco Use  . Smoking status: Current Every Day Smoker    Packs/day: 1.00    Years: 30.00    Pack years: 30.00    Types: Cigarettes  . Smokeless tobacco: Former Engineer, water Use Topics  . Alcohol use: No  . Drug use: No     Allergies   Patient has no known allergies.   Review of Systems Review of Systems  Musculoskeletal: Positive for back pain.  All other systems reviewed and are negative.    Physical Exam Updated Vital Signs BP (!) 145/88 (BP Location: Right Arm)   Pulse 65   Temp 98.4 F (36.9 C) (Oral)   Resp 16   SpO2 100%   Physical Exam Vitals signs and nursing note reviewed.  Constitutional:      General: He is not in acute distress.    Appearance: He is well-developed.     Comments: Appears nontoxic  HENT:     Head: Normocephalic and atraumatic.  Eyes:     Conjunctiva/sclera: Conjunctivae normal.     Pupils: Pupils are equal, round, and reactive to light.  Neck:     Musculoskeletal: Normal range of motion and neck supple.  Cardiovascular:  Rate and Rhythm: Normal rate and regular rhythm.  Pulmonary:     Effort: Pulmonary effort is normal. No respiratory distress.     Breath sounds: Normal breath sounds. No wheezing.  Abdominal:     General: There is no distension.     Palpations: Abdomen is soft. There is no mass.     Tenderness: There is no abdominal tenderness. There is left CVA tenderness. There is no guarding.     Comments: TTP of L flank.  No anterior abdominal tenderness.  No rigidity, guarding, distention.  Negative rebound.  Musculoskeletal: Normal range of motion.     Comments: No reproducible tenderness palpation of low back musculature.  No pain over midline spine or right side back.  Strength of lower extremities intact bilaterally.  Sensation intact bilaterally.  No sign of paresthesias.   Patellar reflexes intact.  Patient is ambulatory with pain.  Skin:    General: Skin is warm and dry.     Capillary Refill: Capillary refill takes less than 2 seconds.  Neurological:     Mental Status: He is alert and oriented to person, place, and time.      ED Treatments / Results  Labs (all labs ordered are listed, but only abnormal results are displayed) Labs Reviewed  CBC WITH DIFFERENTIAL/PLATELET  COMPREHENSIVE METABOLIC PANEL  URINALYSIS, ROUTINE W REFLEX MICROSCOPIC    EKG None  Radiology No results found.  Procedures Procedures (including critical care time)  Medications Ordered in ED Medications - No data to display   Initial Impression / Assessment and Plan / ED Course  I have reviewed the triage vital signs and the nursing notes.  Pertinent labs & imaging results that were available during my care of the patient were reviewed by me and considered in my medical decision making (see chart for details).     Pt presenting for evaluation of several weeks of worsening back pain.  Physical exam shows patient who appears nontoxic.  However, pain is reproducible over the night, no reproducible pain in the low back.  As such, will rule out kidney injury with labs, urine, and CT.  Additionally, patient with a history of IV drug use, however he is afebrile and without midline spinal tenderness. Neuro intact. As such, doubt spinal abscess or discitis.  Pt signed out to [employer], PA-C for f/u on labs and imaging. If negative, plan for f/u with Dr. Jordan Likes and [facility] tx with NSAIDs and robaxin.   Final Clinical Impressions(s) / ED Diagnoses   Final diagnoses:  None    ED Discharge Orders    None       Alveria Apley, PA-C 09/27/18 1553    Eber Hong, MD 09/30/18 1008

## 2018-09-27 NOTE — Discharge Instructions (Addendum)
It was my pleasure taking care of you today!   Please take all of your antibiotics until finished!  Stay very well hydrated with plenty of water throughout the day.   Follow up with primary care physician in 1 week for recheck of ongoing symptoms.  Please seek immediate care if you develop the following: Your symptoms are no better or worse in 3 days. There is severe back pain or lower abdominal pain.  You have a fever.  There is nausea or vomiting.  There is continued burning or discomfort with urination.  You have any additional concerns.

## 2018-09-27 NOTE — ED Provider Notes (Signed)
Care assumed from previous provider PA Caccavale . Please see note for further details. Case discussed, plan agreed upon. Will follow up on pending urinalysis, blood work and CT abdomen pelvis.   Labs reviewed: Normal white count, nl kidney function. UA concerning for infection: Nitrite positive, small leuks, 6-30 WBCs and many bacteria.  CT scan with no acute findings.  Given dose of Rocephin in the ER.  He is tolerating PO.  We discussed reasons to return to the emergency department including development of fever, persistent vomiting, new or worsening symptoms or any additional concerns.  He will follow-up with his primary care doctor in 1 week, sooner if he is not improving over the next 2 to 3 days.  Rx for Keflex given.  All questions answered.   Ward, Chase Picket, PA-C 09/27/18 1738    Eber Hong, MD 09/30/18 [phone]

## 2018-09-27 NOTE — ED Triage Notes (Signed)
Pt reports left sided back pain for several weeks now. Denies any urinary s/s. Pt a.o, nad noted

## 2018-09-27 NOTE — ED Notes (Signed)
ED Provider at bedside. 

## 2018-09-29 LAB — URINE CULTURE: Culture: >=100000 — AB

## 2018-09-30 ENCOUNTER — Telehealth: Payer: Self-pay | Admitting: *Deleted

## 2018-09-30 NOTE — Telephone Encounter (Signed)
Post ED Visit - Positive Culture Follow-up  Culture report reviewed by antimicrobial stewardship pharmacist:  []  Enzo Bi, Pharm.D. []  Celedonio Miyamoto, Pharm.D., BCPS AQ-ID []  Garvin Fila, Pharm.D., BCPS []  Georgina Pillion, [address].D., BCPS []  [place], [address].D., BCPS, AAHIVP []  Estella Husk, Pharm.D., BCPS, AAHIVP []  Lysle Pearl, PharmD, BCPS []  Phillips Climes, PharmD, BCPS [x]  Agapito Games, PharmD, BCPS []  Verlan Friends, PharmD  Positive urine culture Treated with Cephalexin, organism sensitive to the same and no further patient follow-up is required at this time.  [facility] 09/30/2018, 9:12 AM

## 2018-10-02 ENCOUNTER — Emergency Department (HOSPITAL_COMMUNITY)
Admission: EM | Admit: 2018-10-02 | Discharge: 2018-10-02 | Disposition: A | Discharge status: Home / Self Care | Payer: Medicaid Other | Attending: Emergency Medicine | Admitting: Emergency Medicine

## 2018-10-02 ENCOUNTER — Other Ambulatory Visit: Payer: Self-pay

## 2018-10-02 DIAGNOSIS — R109 Unspecified abdominal pain: Secondary | ICD-10-CM | POA: Insufficient documentation

## 2018-10-02 DIAGNOSIS — R197 Diarrhea, unspecified: Secondary | ICD-10-CM | POA: Insufficient documentation

## 2018-10-02 DIAGNOSIS — R140 Abdominal distension (gaseous): Secondary | ICD-10-CM | POA: Insufficient documentation

## 2018-10-02 DIAGNOSIS — R1110 Vomiting, unspecified: Secondary | ICD-10-CM | POA: Insufficient documentation

## 2018-10-02 DIAGNOSIS — I10 Essential (primary) hypertension: Secondary | ICD-10-CM | POA: Insufficient documentation

## 2018-10-02 DIAGNOSIS — F17210 Nicotine dependence, cigarettes, uncomplicated: Secondary | ICD-10-CM | POA: Insufficient documentation

## 2018-10-02 LAB — CBC WITH DIFFERENTIAL/PLATELET
Abs Immature Granulocytes: 0.02 10*3/uL (ref 0.00–0.07)
Basophils Absolute: 0 10*3/uL (ref 0.0–0.1)
Basophils Relative: 1 %
Eosinophils Absolute: 0.2 10*3/uL (ref 0.0–0.5)
Eosinophils Relative: 2 %
HCT: 42.9 % (ref 39.0–52.0)
Hemoglobin: 13 g/dL (ref 13.0–17.0)
Immature Granulocytes: 0 %
Lymphocytes Relative: 28 %
Lymphs Abs: 1.9 10*3/uL (ref 0.7–4.0)
MCH: 27.7 pg (ref 26.0–34.0)
MCHC: 30.3 g/dL (ref 30.0–36.0)
MCV: 91.5 fL (ref 80.0–100.0)
Monocytes Absolute: 0.8 10*3/uL (ref 0.1–1.0)
Monocytes Relative: 13 %
NEUTROS PCT: 56 %
Neutro Abs: 3.7 10*3/uL (ref 1.7–7.7)
Platelets: 225 10*3/uL (ref 150–400)
RBC: 4.69 MIL/uL (ref 4.22–5.81)
RDW: 14.2 % (ref 11.5–15.5)
WBC: 6.6 10*3/uL (ref 4.0–10.5)
nRBC: 0 % (ref 0.0–0.2)

## 2018-10-02 LAB — COMPREHENSIVE METABOLIC PANEL
ALT: 26 U/L (ref 0–44)
AST: 32 U/L (ref 15–41)
Albumin: 3.6 g/dL (ref 3.5–5.0)
Alkaline Phosphatase: 98 U/L (ref 38–126)
Anion gap: 10 (ref 5–15)
BUN: 9 mg/dL (ref 6–20)
CO2: 26 mmol/L (ref 22–32)
Calcium: 9.1 mg/dL (ref 8.9–10.3)
Chloride: 100 mmol/L (ref 98–111)
Creatinine, Ser: 0.79 mg/dL (ref 0.61–1.24)
GFR calc Af Amer: >60 mL/min (ref >60)
GFR calc non Af Amer: >60 mL/min (ref >60)
Glucose, Bld: 89 mg/dL (ref 70–99)
Potassium: 4.1 mmol/L (ref 3.5–5.1)
SODIUM: 136 mmol/L (ref 135–145)
Total Bilirubin: 0.8 mg/dL (ref 0.3–1.2)
Total Protein: 7.7 g/dL (ref 6.5–8.1)

## 2018-10-02 LAB — URINALYSIS, ROUTINE W REFLEX MICROSCOPIC
Bilirubin Urine: NEGATIVE
Glucose, UA: NEGATIVE mg/dL
Hgb urine dipstick: NEGATIVE
Ketones, ur: NEGATIVE mg/dL
LEUKOCYTE UA: NEGATIVE
Nitrite: NEGATIVE
Protein, ur: NEGATIVE mg/dL
Specific Gravity, Urine: 1.021 (ref 1.005–1.030)
pH: 5 (ref 5.0–8.0)

## 2018-10-02 LAB — LIPASE, BLOOD: Lipase: 21 U/L (ref 11–51)

## 2018-10-02 MED ORDER — LACTATED RINGERS IV BOLUS
1000.0000 mL | Freq: Once | INTRAVENOUS | Status: AC
  Administered 2018-10-02: 1000 mL via INTRAVENOUS

## 2018-10-02 MED ORDER — HYDROCODONE-ACETAMINOPHEN 5-325 MG PO TABS
2.0000 | ORAL_TABLET | Freq: Once | ORAL | Status: AC
  Administered 2018-10-02: 2 via ORAL
  Filled 2018-10-02: qty 2

## 2018-10-02 MED ORDER — HYDROCODONE-ACETAMINOPHEN 5-325 MG PO TABS: 1.0000 | ORAL_TABLET | ORAL | 0 refills | Status: DC | PRN

## 2018-10-02 NOTE — Discharge Instructions (Addendum)
If you develop fever, vomiting, severe worsening pain, trouble urinating, weakness or numbness in your legs, incontinence, or any other new/concerning symptoms, then return to the ER for evaluation.

## 2018-10-02 NOTE — ED Triage Notes (Signed)
Pt here for evaluation of bilateral flank pain, sts was dx with kidney infection when seen earlier this month for L sided flank pain but now pain is on both sides. Pt now endorses N/V/D and blood in stool.

## 2018-10-02 NOTE — ED Provider Notes (Signed)
[facility] EMERGENCY DEPARTMENT Provider Note   CSN: [id number] Arrival date & time: 10/02/18  1334     History   Chief Complaint Chief Complaint  Patient presents with  . Back Pain    HPI Eric Chang is a 48 y.o. male.  HPI  48 year old male presents with bilateral flank pain.  For about a week or 2 he has been having left flank pain but now over the last several days it has gone to the right.  No fevers.  He was diagnosed with an acute urinary tract infection and placed on Keflex.  He has been taking this 4 times per day.  Yesterday he had a couple episodes of vomiting.  He is also been having profuse diarrhea for the last 2 or 3 days.  It is liquidy but without blood.  He is had a little bit of blood when he wipes on the toilet paper and feels like his bottom is raw.  No urinary symptoms.  He feels like his abdomen is full of gas and uncomfortable but no abdominal pain.  He has chronic back pain but this is different.  He is using naproxen and a muscle relaxer without relief.  There is no midline back pain, weakness, numbness in the extremities.  Past Medical History:  Diagnosis Date  . Hypertension   . Polysubstance abuse (HCC)   . Tobacco abuse   . UTI (lower urinary tract infection)     Patient Active Problem List   Diagnosis Date Noted  . Acute encephalopathy 11/07/2015  . Overdose 11/07/2015  . Polysubstance abuse (HCC)   . Essential hypertension   . Tobacco abuse     Past Surgical History:  Procedure Laterality Date  . BACK SURGERY          Home Medications    Prior to Admission medications   Medication Sig Start Date End Date Taking? Authorizing Provider  cephALEXin (KEFLEX) 500 MG capsule Take 1 capsule (500 mg total) by mouth 4 (four) times daily. 09/27/18   Ward, Chase Picket, PA-C  HYDROcodone-acetaminophen (NORCO) 5-325 MG tablet Take 1 tablet by mouth every 4 (four) hours as needed. 10/02/18   Pricilla Loveless, MD  methocarbamol  (ROBAXIN) 500 MG tablet Take 1 tablet (500 mg total) by mouth at bedtime as needed for muscle spasms. 09/27/18   Caccavale, Sophia, PA-C  naproxen (NAPROSYN) 500 MG tablet Take 1 tablet (500 mg total) by mouth 2 (two) times daily with a meal. 09/27/18   Caccavale, Sophia, PA-C    Family History No family history on file.  Social History Social History   Tobacco Use  . Smoking status: Current Every Day Smoker    Packs/day: 1.00    Years: 30.00    Pack years: 30.00    Types: Cigarettes  . Smokeless tobacco: Former Engineer, water Use Topics  . Alcohol use: No  . Drug use: No     Allergies   Patient has no known allergies.   Review of Systems Review of Systems  Constitutional: Negative for fever.  Gastrointestinal: Positive for diarrhea and vomiting. Negative for abdominal pain and blood in stool.  Genitourinary: Positive for flank pain. Negative for dysuria and hematuria.  Musculoskeletal: Positive for back pain.  All other systems reviewed and are negative.    Physical Exam Updated Vital Signs BP 128/70 (BP Location: Right Arm)   Pulse 61   Temp 98.4 F (36.9 C) (Oral)   Resp 16   Ht  6\' 3"  (1.905 m)   Wt 108 kg   SpO2 100%   BMI 29.75 kg/m   Physical Exam Vitals signs and nursing note reviewed.  Constitutional:      General: He is not in acute distress.    Appearance: He is well-developed. He is obese. He is not ill-appearing or diaphoretic.  HENT:     Head: Normocephalic and atraumatic.     Right Ear: External ear normal.     Left Ear: External ear normal.     Nose: Nose normal.  Eyes:     General:        Right eye: No discharge.        Left eye: No discharge.  Neck:     Musculoskeletal: Neck supple.  Cardiovascular:     Rate and Rhythm: Normal rate and regular rhythm.     Heart sounds: Normal heart sounds.  Pulmonary:     Effort: Pulmonary effort is normal.     Breath sounds: Normal breath sounds.  Abdominal:     General: There is no distension.       Palpations: Abdomen is soft.     Tenderness: There is no abdominal tenderness. There is right CVA tenderness (mild) and left CVA tenderness (mild).  Genitourinary:    Comments: Patient defers rectal exam Musculoskeletal:     Thoracic back: He exhibits no bony tenderness.     Lumbar back: He exhibits no bony tenderness.  Skin:    General: Skin is warm and dry.  Neurological:     Mental Status: He is alert.     Comments: 5/5 strength in BLE. Normal gross sensation  Psychiatric:        Mood and Affect: Mood is not anxious.      ED Treatments / Results  Labs (all labs ordered are listed, but only abnormal results are displayed) Labs Reviewed  C DIFFICILE QUICK SCREEN W PCR REFLEX  COMPREHENSIVE METABOLIC PANEL  LIPASE, BLOOD  CBC WITH DIFFERENTIAL/PLATELET  URINALYSIS, ROUTINE W REFLEX MICROSCOPIC    EKG None  Radiology No results found.  Procedures Procedures (including critical care time)  Medications Ordered in ED Medications  lactated ringers bolus 1,000 mL (0 mLs Intravenous Stopped 10/02/18 1606)  HYDROcodone-acetaminophen (NORCO/VICODIN) 5-325 MG per tablet 2 tablet (2 tablets Oral Given 10/02/18 1421)     Initial Impression / Assessment and Plan / ED Course  I have reviewed the triage vital signs and the nursing notes.  Pertinent labs & imaging results that were available during my care of the patient were reviewed by me and considered in my medical decision making (see chart for details).     Patient's labs are overall reassuring and benign including normal renal function and WBC.  His urine is now clear.  The bilateral flank pain could be CVA/renal disease, but given the labs are reassuring and the urine is clear, probably this is more muscular.  He does endorse a lot of pain with certain movements and twisting.  Continue NSAIDs, short course of Norco, but otherwise he appears stable for discharge home.  Given the benign work-up now, I do not think an  emergent CT scan would be warranted as I have low suspicion for acute intra-abdominal/retroperitoneal emergency such as renal abscess.  Given the diarrhea post antibiotics I was trying to test for C. difficile but he is unable to produce a bowel movement here.  Final Clinical Impressions(s) / ED Diagnoses   Final diagnoses:  Bilateral flank pain  ED Discharge Orders         Ordered    HYDROcodone-acetaminophen (NORCO) 5-325 MG tablet  Every 4 hours PRN     10/02/18 1556           Pricilla LovelessGoldston, Donaldo Teegarden, MD 10/02/18 1921

## 2019-01-27 ENCOUNTER — Other Ambulatory Visit: Payer: Self-pay | Admitting: Neurosurgery

## 2019-01-27 DIAGNOSIS — M5416 Radiculopathy, lumbar region: Secondary | ICD-10-CM

## 2020-04-26 ENCOUNTER — Encounter (HOSPITAL_BASED_OUTPATIENT_CLINIC_OR_DEPARTMENT_OTHER): Payer: Self-pay

## 2020-04-26 ENCOUNTER — Emergency Department (HOSPITAL_BASED_OUTPATIENT_CLINIC_OR_DEPARTMENT_OTHER)
Admission: EM | Admit: 2020-04-26 | Discharge: 2020-04-26 | Disposition: A | Discharge status: Home / Self Care | Payer: Medicaid Other | Attending: Emergency Medicine | Admitting: Emergency Medicine

## 2020-04-26 ENCOUNTER — Other Ambulatory Visit: Payer: Self-pay

## 2020-04-26 DIAGNOSIS — L308 Other specified dermatitis: Secondary | ICD-10-CM | POA: Insufficient documentation

## 2020-04-26 DIAGNOSIS — F17210 Nicotine dependence, cigarettes, uncomplicated: Secondary | ICD-10-CM | POA: Insufficient documentation

## 2020-04-26 DIAGNOSIS — I10 Essential (primary) hypertension: Secondary | ICD-10-CM | POA: Insufficient documentation

## 2020-04-26 NOTE — Discharge Instructions (Addendum)
Continue to apply the topical hydrocortisone cortisone cream to the affected areas. You are cleared to follow-up with [facility] for heroin abuse treatment.

## 2020-04-26 NOTE — ED Provider Notes (Signed)
[facility] EMERGENCY DEPARTMENT Provider Note   CSN: [id number] Arrival date & time: 04/26/20  1152     History Chief Complaint  Patient presents with  . Rash    Eric Chang is a 49 y.o. male.  Patient with rash to back of several months duration. History of eczema. Patient is currently attempting to gain voluntary admission to [facility] for heroin abuse. His last heroin use was one week ago. He denies chest pain, shortness of breath, abdominal pain, N/V/D. No history of diabetes, hypertension, or kidney disease.  The history is provided by the patient.  Rash Location:  Torso Torso rash location:  Upper back Quality: dryness, itchiness and scaling   Severity:  Moderate Onset quality:  Gradual Duration:  2 months Timing:  Intermittent Progression:  Partially resolved Chronicity:  Recurrent Relieved by:  Topical steroids      Past Medical History:  Diagnosis Date  . Hypertension   . Polysubstance abuse (HCC)   . Tobacco abuse   . UTI (lower urinary tract infection)     Patient Active Problem List   Diagnosis Date Noted  . Acute encephalopathy 11/07/2015  . Overdose 11/07/2015  . Polysubstance abuse (HCC)   . Essential hypertension   . Tobacco abuse     Past Surgical History:  Procedure Laterality Date  . BACK SURGERY         No family history on file.  Social History   Tobacco Use  . Smoking status: Current Every Day Smoker    Packs/day: 1.00    Years: 30.00    Pack years: 30.00    Types: Cigarettes  . Smokeless tobacco: Former Clinical biochemist  . Vaping Use: Never used  Substance Use Topics  . Alcohol use: No  . Drug use: Not Currently    Types: IV    Home Medications Prior to Admission medications   Medication Sig Start Date End Date Taking? Authorizing Provider  cephALEXin (KEFLEX) 500 MG capsule Take 1 capsule (500 mg total) by mouth 4 (four) times daily. 09/27/18   Ward, Chase Picket, PA-C  HYDROcodone-acetaminophen (NORCO)  5-325 MG tablet Take 1 tablet by mouth every 4 (four) hours as needed. 10/02/18   Pricilla Loveless, MD  methocarbamol (ROBAXIN) 500 MG tablet Take 1 tablet (500 mg total) by mouth at bedtime as needed for muscle spasms. 09/27/18   Caccavale, Sophia, PA-C  naproxen (NAPROSYN) 500 MG tablet Take 1 tablet (500 mg total) by mouth 2 (two) times daily with a meal. 09/27/18   Caccavale, Sophia, PA-C    Allergies    Patient has no known allergies.  Review of Systems   Review of Systems  Skin: Positive for rash.  All other systems reviewed and are negative.   Physical Exam Updated Vital Signs BP 126/80   Pulse 60   Temp 98.6 F (37 C) (Oral)   Resp 20   Ht 6' 2.5" (1.892 m)   Wt 130.2 kg   SpO2 96%   BMI 36.37 kg/m   Physical Exam Vitals and nursing note reviewed.  Constitutional:      Appearance: Normal appearance.  HENT:     Head: Normocephalic.     Nose: Nose normal.  Eyes:     Conjunctiva/sclera: Conjunctivae normal.  Cardiovascular:     Rate and Rhythm: Normal rate and regular rhythm.  Pulmonary:     Effort: Pulmonary effort is normal.     Breath sounds: Normal breath sounds.  Abdominal:  Palpations: Abdomen is soft.  Musculoskeletal:        General: Normal range of motion.  Skin:    General: Skin is warm and dry.     Findings: Rash present.     Comments: Thickened, scaly skin on upper back adjacent/lateral to scapulas. No sign of infection.  Neurological:     Mental Status: He is alert and oriented to person, place, and time.  Psychiatric:        Mood and Affect: Mood normal.     ED Results / Procedures / Treatments   Labs (all labs ordered are listed, but only abnormal results are displayed) Labs Reviewed - No data to display  EKG None  Radiology No results found.  Procedures Procedures (including critical care time)  Medications Ordered in ED Medications - No data to display  ED Course  I have reviewed the triage vital signs and the nursing  notes.  Pertinent labs & imaging results that were available during my care of the patient were reviewed by me and considered in my medical decision making (see chart for details).    MDM Rules/Calculators/A&P                          Patient with rash consistent with exzema. No signs of infection. Discharge with symptomatic treatment.      Final Clinical Impression(s) / ED Diagnoses Final diagnoses:  Other eczema    Rx / DC Orders ED Discharge Orders    None       Felicie Morn, NP 04/26/20 1620    Terrilee Files, MD 04/26/20 [phone]

## 2020-04-26 NOTE — ED Triage Notes (Signed)
Pt c/o rash to his back x "months"-states he was sent by [facility] to be cleared for admit for heroin use-NAD-steady gait

## 2021-08-13 ENCOUNTER — Ambulatory Visit (HOSPITAL_COMMUNITY): Payer: Medicaid Other

## 2021-08-13 ENCOUNTER — Encounter (HOSPITAL_COMMUNITY): Payer: Self-pay

## 2021-08-13 ENCOUNTER — Emergency Department (HOSPITAL_COMMUNITY)
Admission: EM | Admit: 2021-08-13 | Discharge: 2021-08-13 | Disposition: A | Discharge status: Home / Self Care | Payer: Medicaid Other | Attending: Emergency Medicine | Admitting: Emergency Medicine

## 2021-08-13 DIAGNOSIS — Z5321 Procedure and treatment not carried out due to patient leaving prior to being seen by health care provider: Secondary | ICD-10-CM

## 2021-08-13 DIAGNOSIS — M7989 Other specified soft tissue disorders: Secondary | ICD-10-CM | POA: Insufficient documentation

## 2021-08-13 DIAGNOSIS — I10 Essential (primary) hypertension: Secondary | ICD-10-CM | POA: Insufficient documentation

## 2021-08-13 DIAGNOSIS — F17210 Nicotine dependence, cigarettes, uncomplicated: Secondary | ICD-10-CM | POA: Insufficient documentation

## 2021-08-13 NOTE — ED Notes (Addendum)
X4 unsuccessful IV attempts by 3 RNs

## 2021-08-13 NOTE — ED Triage Notes (Signed)
Pt presents with c/o bilateral leg swelling for several months.

## 2021-08-13 NOTE — ED Notes (Signed)
IV attempt x 2 unsuccessful. Another RN at bedside to attempt.

## 2021-08-13 NOTE — ED Notes (Signed)
Pt told registration at charge desk that he was leaving because he has been waiting here all day. Pt seen exiting ER before this RN could enter room and obtain vital signs or speak with pt. Pt ambulatory to exit.

## 2021-08-13 NOTE — ED Provider Notes (Signed)
[facility]-EMERGENCY DEPT Provider Note   CSN: [id number] Arrival date & time: 08/13/21  0940     History Chief Complaint  Patient presents with   Leg Swelling    Eric Chang is a 50 y.o. male presents to the ED for evaluation of gradually worsening bilateral leg swelling for the past 2-3 months. Denies any trauma, falls, or MVCs to the area. Additionally, he reports dyspnea on exertion worsening for the past month. He reports he has gained 100lbs in a year. Denies any chest pain, orthopnea, abdominal pain, nausea, or vomiting, or URI symptoms. The patient is currently on Subutex. He reports s previously history of HTN but is not on any medication. NKDA. Daily smoker. 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    HPI     Past Medical History:  Diagnosis Date   Hypertension    Polysubstance abuse (HCC)    Tobacco abuse    UTI (lower urinary tract infection)     Patient Active Problem List   Diagnosis Date Noted   Acute encephalopathy 11/07/2015   Overdose 11/07/2015   Polysubstance abuse (HCC)    Essential hypertension    Tobacco abuse     Past Surgical History:  Procedure Laterality Date   BACK SURGERY         History reviewed. No pertinent family history.  Social History   Tobacco Use   Smoking status: Every Day    Packs/day: 1.00    Years: 30.00    Pack years: 30.00    Types: Cigarettes   Smokeless tobacco: Former  Building services engineer Use: Never used  Substance Use Topics   Alcohol use: No   Drug use: Not Currently    Types: IV    Home Medications Prior to Admission medications   Medication Sig Start Date End Date Taking? Authorizing Provider  antiseptic oral rinse (BIOTENE) LIQD 15 mLs by Mouth Rinse route 2 (two) times daily as needed for dry mouth.   Yes [provider]  Cholecalciferol (VITAMIN D3) 50 MCG (2000 UT) capsule Take 2,000 Units by mouth daily. 08/03/21  Yes [provider]  DULoxetine (CYMBALTA)  30 MG capsule Take 30 mg by mouth 2 (two) times daily. 08/03/21  Yes [provider]  Ferrous Sulfate (IRON) 325 (65 Fe) MG TABS Take 1 tablet by mouth daily. 07/05/21  Yes [provider]  hydrOXYzine (VISTARIL) 50 MG capsule Take 50 mg by mouth 3 (three) times daily as needed for anxiety or sleep. 08/03/21  Yes [provider]  ibuprofen (ADVIL) 200 MG tablet Take 800 mg by mouth every 6 (six) hours as needed for mild pain or headache.   Yes [provider]  LASIX 20 MG tablet Take 20 mg by mouth daily. 08/07/21  Yes [provider]  meloxicam (MOBIC) 7.5 MG tablet Take 7.5 mg by mouth 2 (two) times daily. 08/03/21  Yes [provider]    Allergies    Patient has no known allergies.  Review of Systems   Review of Systems  Constitutional:  Negative for chills and fever.  HENT:  Negative for ear pain and sore throat.   Eyes:  Negative for pain and visual disturbance.  Respiratory:  Positive for shortness of breath. Negative for cough.   Cardiovascular:  Positive for leg swelling. Negative for chest pain and palpitations.  Gastrointestinal:  Negative for abdominal pain and vomiting.  Genitourinary:  Negative for dysuria and hematuria.  Musculoskeletal:  Negative  for arthralgias and back pain.  Skin:  Negative for color change and rash.  Neurological:  Negative for seizures and syncope.  All other systems reviewed and are negative.  Physical Exam Updated Vital Signs BP (!) 187/107 (BP Location: Left Arm)    Pulse 77    Temp 97.9 F (36.6 C) (Oral)    Resp 18    SpO2 93%   Physical Exam Vitals and nursing note reviewed.  Constitutional:      Appearance: Normal appearance.  HENT:     Head: Normocephalic and atraumatic.  Eyes:     General: No scleral icterus. Cardiovascular:     Rate and Rhythm: Normal rate and regular rhythm.  Pulmonary:     Effort: Pulmonary effort is normal. No respiratory distress.     Breath sounds:  Wheezing present.     Comments: End expiratory wheezing, the patient is speaking in full sentences with ease.  Abdominal:     General: Abdomen is flat. Bowel sounds are normal.     Palpations: Abdomen is soft.  Musculoskeletal:        General: No deformity.     Cervical back: Normal range of motion.     Right lower leg: Edema present.     Left lower leg: Edema present.     Comments: 3+ bilateral leg edema with trace edema superior to the knees. Ambulatory. No wounds or weeping noted.  Skin:    General: Skin is warm and dry.  Neurological:     General: No focal deficit present.     Mental Status: He is alert. Mental status is at baseline.    ED Results / Procedures / Treatments   Labs (all labs ordered are listed, but only abnormal results are displayed) Labs Reviewed  CBC WITH DIFFERENTIAL/PLATELET  COMPREHENSIVE METABOLIC PANEL  BRAIN NATRIURETIC PEPTIDE    EKG None  Radiology No results found.  Procedures Procedures   Medications Ordered in ED Medications - No data to display  ED Course  I have reviewed the triage vital signs and the nursing notes.  Pertinent labs & imaging results that were available during my care of the patient were reviewed by me and considered in my medical decision making (see chart for details).  50 y/o M presents to the ED for evaluation of worsening leg edema and SOB. Differential diagnosis includes venous stasis, DVT, CHF, pneumonia, COPD, asthma. Vital signs shows HTN at 187/107 with slightly decreased pulse ox at 93%, however the patient is speaking in full sentences. Will obtain labs and CXR.  Nursing informed that they will need to call in IV team.  The patient eloped prior to obtaining labs. Workup was not completed.    MDM Rules/Calculators/A&P                          Final Clinical Impression(s) / ED Diagnoses Final diagnoses:  None    Rx / DC Orders ED Discharge Orders     None        Sherrell Puller,  Hershal Coria 08/14/21 2239    Lacretia Leigh, MD 08/18/21 [phone]

## 2021-08-19 ENCOUNTER — Other Ambulatory Visit: Payer: Self-pay

## 2021-08-19 ENCOUNTER — Emergency Department (HOSPITAL_COMMUNITY)
Admission: EM | Admit: 2021-08-19 | Discharge: 2021-08-19 | Disposition: A | Discharge status: Home / Self Care | Payer: Self-pay | Attending: Emergency Medicine | Admitting: Emergency Medicine

## 2021-08-19 ENCOUNTER — Encounter (HOSPITAL_COMMUNITY): Payer: Self-pay | Admitting: *Deleted

## 2021-08-19 ENCOUNTER — Emergency Department (HOSPITAL_COMMUNITY): Payer: Self-pay

## 2021-08-19 ENCOUNTER — Emergency Department (HOSPITAL_BASED_OUTPATIENT_CLINIC_OR_DEPARTMENT_OTHER): Payer: Self-pay

## 2021-08-19 DIAGNOSIS — Z20822 Contact with and (suspected) exposure to covid-19: Secondary | ICD-10-CM | POA: Insufficient documentation

## 2021-08-19 DIAGNOSIS — M79605 Pain in left leg: Secondary | ICD-10-CM

## 2021-08-19 DIAGNOSIS — N3000 Acute cystitis without hematuria: Secondary | ICD-10-CM | POA: Insufficient documentation

## 2021-08-19 DIAGNOSIS — R609 Edema, unspecified: Secondary | ICD-10-CM

## 2021-08-19 DIAGNOSIS — R059 Cough, unspecified: Secondary | ICD-10-CM | POA: Insufficient documentation

## 2021-08-19 DIAGNOSIS — R600 Localized edema: Secondary | ICD-10-CM | POA: Insufficient documentation

## 2021-08-19 DIAGNOSIS — M79604 Pain in right leg: Secondary | ICD-10-CM

## 2021-08-19 DIAGNOSIS — Z79899 Other long term (current) drug therapy: Secondary | ICD-10-CM | POA: Insufficient documentation

## 2021-08-19 DIAGNOSIS — R0602 Shortness of breath: Secondary | ICD-10-CM | POA: Insufficient documentation

## 2021-08-19 LAB — BASIC METABOLIC PANEL
Anion gap: 6 (ref 5–15)
BUN: 8 mg/dL (ref 6–20)
CO2: 27 mmol/L (ref 22–32)
Calcium: 8.5 mg/dL — ABNORMAL LOW (ref 8.9–10.3)
Chloride: 101 mmol/L (ref 98–111)
Creatinine, Ser: 0.89 mg/dL (ref 0.61–1.24)
GFR, Estimated: >60 mL/min (ref >60)
Glucose, Bld: 124 mg/dL — ABNORMAL HIGH (ref 70–99)
Potassium: 3.7 mmol/L (ref 3.5–5.1)
Sodium: 134 mmol/L — ABNORMAL LOW (ref 135–145)

## 2021-08-19 LAB — CBC
HCT: 34 % — ABNORMAL LOW (ref 39.0–52.0)
Hemoglobin: 10.5 g/dL — ABNORMAL LOW (ref 13.0–17.0)
MCH: 25.4 pg — ABNORMAL LOW (ref 26.0–34.0)
MCHC: 30.9 g/dL (ref 30.0–36.0)
MCV: 82.1 fL (ref 80.0–100.0)
Platelets: 246 10*3/uL (ref 150–400)
RBC: 4.14 MIL/uL — ABNORMAL LOW (ref 4.22–5.81)
RDW: 15.3 % (ref 11.5–15.5)
WBC: 7.4 10*3/uL (ref 4.0–10.5)
nRBC: 0 % (ref 0.0–0.2)

## 2021-08-19 LAB — URINALYSIS, ROUTINE W REFLEX MICROSCOPIC
Bilirubin Urine: NEGATIVE
Glucose, UA: NEGATIVE mg/dL
Hgb urine dipstick: NEGATIVE
Ketones, ur: NEGATIVE mg/dL
Nitrite: NEGATIVE
Protein, ur: NEGATIVE mg/dL
Specific Gravity, Urine: 1.013 (ref 1.005–1.030)
pH: 6 (ref 5.0–8.0)

## 2021-08-19 LAB — TROPONIN I (HIGH SENSITIVITY)
Troponin I (High Sensitivity): 17 ng/L (ref <18)
Troponin I (High Sensitivity): 14 ng/L

## 2021-08-19 LAB — RAPID URINE DRUG SCREEN, HOSP PERFORMED
Amphetamines: NOT DETECTED
Barbiturates: NOT DETECTED
Benzodiazepines: NOT DETECTED
Cocaine: NOT DETECTED
Opiates: NOT DETECTED
Tetrahydrocannabinol: NOT DETECTED

## 2021-08-19 LAB — RESP PANEL BY RT-PCR (FLU A&B, COVID) ARPGX2
Influenza A by PCR: NEGATIVE
Influenza B by PCR: NEGATIVE
SARS Coronavirus 2 by RT PCR: NEGATIVE

## 2021-08-19 LAB — BRAIN NATRIURETIC PEPTIDE: B Natriuretic Peptide: 137.4 pg/mL — ABNORMAL HIGH (ref 0.0–100.0)

## 2021-08-19 MED ORDER — FUROSEMIDE 20 MG PO TABS: 20.0000 mg | ORAL_TABLET | Freq: Every day | ORAL | 0 refills | Status: DC

## 2021-08-19 MED ORDER — CEPHALEXIN 500 MG PO CAPS: 500.0000 mg | ORAL_CAPSULE | Freq: Four times a day (QID) | ORAL | 0 refills | Status: AC

## 2021-08-19 NOTE — Discharge Instructions (Addendum)
Your history, exam, work-up today is consistent with a urinary tract infection for which you will take antibiotics, and some fluid overload that we discovered.  Her hemoglobin was also slightly decreased from prior so please follow-up with both your primary doctor and a cardiologist for reassessment and repeat lab testing.  Please restart the diuretic Lasix as you have been on the past with success and rest.  If any symptoms change or worsen acutely, please return to the nearest emergency department.

## 2021-08-19 NOTE — ED Notes (Signed)
Pt ambulated with pulse ox and SpO2 remained above 96% for entire time. Denies sob.

## 2021-08-19 NOTE — ED Provider Notes (Signed)
[facility] EMERGENCY DEPARTMENT Provider Note   CSN: [id number] Arrival date & time: 08/19/21  [id number]     History  Chief Complaint  Patient presents with   Leg Swelling    Eric Chang is a 51 y.o. male.  The history is provided by the patient and medical records. No language interpreter was used.  Shortness of Breath Severity:  Severe Onset quality:  Gradual Duration:  1 week Timing:  Intermittent Progression:  Waxing and waning Chronicity:  New Context: activity   Relieved by:  Rest Worsened by:  Exertion Ineffective treatments:  None tried Associated symptoms: cough and sputum production (white)   Associated symptoms: no abdominal pain, no chest pain, no diaphoresis, no fever, no headaches, no hemoptysis, no neck pain, no vomiting and no wheezing   Risk factors: obesity       Home Medications Prior to Admission medications   Medication Sig Start Date End Date Taking? Authorizing Provider  antiseptic oral rinse (BIOTENE) LIQD 15 mLs by Mouth Rinse route 2 (two) times daily as needed for dry mouth.    [provider]  Cholecalciferol (VITAMIN D3) 50 MCG (2000 UT) capsule Take 2,000 Units by mouth daily. 08/03/21   [provider]  DULoxetine (CYMBALTA) 30 MG capsule Take 30 mg by mouth 2 (two) times daily. 08/03/21   [provider]  Ferrous Sulfate (IRON) 325 (65 Fe) MG TABS Take 1 tablet by mouth daily. 07/05/21   [provider]  hydrOXYzine (VISTARIL) 50 MG capsule Take 50 mg by mouth 3 (three) times daily as needed for anxiety or sleep. 08/03/21   [provider]  ibuprofen (ADVIL) 200 MG tablet Take 800 mg by mouth every 6 (six) hours as needed for mild pain or headache.    [provider]  LASIX 20 MG tablet Take 20 mg by mouth daily. 08/07/21   [provider]  meloxicam (MOBIC) 7.5 MG tablet Take 7.5 mg by mouth 2 (two) times daily. 08/03/21   [provider]       Allergies    Patient has no known allergies.    Review of Systems   Review of Systems  Constitutional:  Negative for chills, diaphoresis, fatigue and fever.  HENT:  Negative for congestion.   Respiratory:  Positive for cough, sputum production (white), chest tightness and shortness of breath. Negative for hemoptysis, wheezing and stridor.   Cardiovascular:  Positive for leg swelling. Negative for chest pain and palpitations.  Gastrointestinal:  Negative for abdominal pain, constipation, diarrhea, nausea and vomiting.  Genitourinary:  Positive for decreased urine volume and urgency. Negative for dysuria and flank pain.  Musculoskeletal:  Negative for back pain and neck pain.  Neurological:  Negative for dizziness, light-headedness and headaches.  Psychiatric/Behavioral:  Negative for agitation.   All other systems reviewed and are negative.  Physical Exam Updated Vital Signs BP (!) 167/102    Pulse 69    Temp 98.2 F (36.8 C) (Oral)    Resp 18    SpO2 96%  Physical Exam Vitals and nursing note reviewed.  Constitutional:      General: He is not in acute distress.    Appearance: He is well-developed. He is not ill-appearing, toxic-appearing or diaphoretic.  HENT:     Head: Normocephalic and atraumatic.     Nose: No congestion or rhinorrhea.     Mouth/Throat:     Mouth: Mucous membranes are moist.     Pharynx: No oropharyngeal exudate or posterior  oropharyngeal erythema.  Eyes:     Conjunctiva/sclera: Conjunctivae normal.  Cardiovascular:     Rate and Rhythm: Normal rate and regular rhythm.     Heart sounds: No murmur heard. Pulmonary:     Effort: Pulmonary effort is normal. No respiratory distress.     Breath sounds: Rales present. No wheezing or rhonchi.  Chest:     Chest wall: No tenderness.  Abdominal:     General: Abdomen is flat.     Palpations: Abdomen is soft.     Tenderness: There is no abdominal tenderness. There is no right CVA tenderness, left CVA tenderness,  guarding or rebound.  Musculoskeletal:        General: No swelling or tenderness.     Cervical back: Neck supple. No tenderness.     Right lower leg: Edema present.     Left lower leg: Edema present.  Skin:    General: Skin is warm and dry.     Capillary Refill: Capillary refill takes less than 2 seconds.     Findings: No erythema or rash.  Neurological:     General: No focal deficit present.     Mental Status: He is alert. Mental status is at baseline.  Psychiatric:        Mood and Affect: Mood normal.    ED Results / Procedures / Treatments   Labs (all labs ordered are listed, but only abnormal results are displayed) Labs Reviewed  BASIC METABOLIC PANEL - Abnormal; Notable for the following components:      Result Value   Sodium 134 (*)    Glucose, Bld 124 (*)    Calcium 8.5 (*)    All other components within normal limits  CBC - Abnormal; Notable for the following components:   RBC 4.14 (*)    Hemoglobin 10.5 (*)    HCT 34.0 (*)    MCH 25.4 (*)    All other components within normal limits  BRAIN NATRIURETIC PEPTIDE - Abnormal; Notable for the following components:   B Natriuretic Peptide 137.4 (*)    All other components within normal limits  URINALYSIS, ROUTINE W REFLEX MICROSCOPIC - Abnormal; Notable for the following components:   Leukocytes,Ua TRACE (*)    Bacteria, UA MANY (*)    All other components within normal limits  RESP PANEL BY RT-PCR (FLU A&B, COVID) ARPGX2  URINE CULTURE  RAPID URINE DRUG SCREEN, HOSP PERFORMED  TROPONIN I (HIGH SENSITIVITY)  TROPONIN I (HIGH SENSITIVITY)    EKG EKG Interpretation  Date/Time:  Sunday August 19 2021 06:30:14 EST Ventricular Rate:  69 PR Interval:  134 QRS Duration: 92 QT Interval:  442 QTC Calculation: 473 R Axis:   13 Text Interpretation: Normal sinus rhythm Normal ECG When compared with ECG of 07-Nov-2015 15:39, PREVIOUS ECG IS PRESENT When compared to prior, similar appearance with less sharpness of the t  waves. No STEMI Confirmed by Antony Blackbird [phone]) on 08/19/2021 8:01:36 AM  Radiology DG Chest 2 View  Result Date: 08/19/2021 CLINICAL DATA:  Shortness of breath EXAM: CHEST - 2 VIEW COMPARISON:  11/13/2015 FINDINGS: Low volume chest with congested appearing hilar vessels. There is no edema, consolidation, effusion, or pneumothorax. Normal heart size and mediastinal contours. IMPRESSION: Congested appearance of hilar vessels.  No edema or focal pneumonia. Electronically Signed   By: Jorje Guild M.D.   On: 08/19/2021 07:41   VAS [place] LOWER EXTREMITY VENOUS (DVT) (ONLY [facility] & [facility])  Result Date: 08/19/2021  Lower Venous DVT Study  Patient Name:  WILLMER HILTON  Date of Exam:   08/19/2021 Medical Rec #: [id number]     Accession #:    [id number] Date of Birth: 1971/02/03     Patient Gender: M Patient Age:   46 years Exam Location:  [facility] Procedure:      VAS [place] LOWER EXTREMITY VENOUS (DVT) Referring Phys: Marda Stalker --------------------------------------------------------------------------------  Indications: Pain.  Limitations: Poor ultrasound/tissue interface and body habitus. Comparison Study: no prior Performing Technologist: Archie Patten RVS  Examination Guidelines: A complete evaluation includes B-mode imaging, spectral Doppler, color Doppler, and power Doppler as needed of all accessible portions of each vessel. Bilateral testing is considered an integral part of a complete examination. Limited examinations for reoccurring indications may be performed as noted. The reflux portion of the exam is performed with the patient in reverse Trendelenburg.  +---------+---------------+---------+-----------+----------+-------------------+  RIGHT     Compressibility Phasicity Spontaneity Properties Thrombus Aging       +---------+---------------+---------+-----------+----------+-------------------+  CFV       Full            Yes       Yes                                          +---------+---------------+---------+-----------+----------+-------------------+  SFJ       Full                                                                  +---------+---------------+---------+-----------+----------+-------------------+  FV Prox   Full                                                                  +---------+---------------+---------+-----------+----------+-------------------+  FV Mid                    Yes       Yes                                         +---------+---------------+---------+-----------+----------+-------------------+  FV Distal                 Yes       Yes                                         +---------+---------------+---------+-----------+----------+-------------------+  PFV       Full                                                                  +---------+---------------+---------+-----------+----------+-------------------+  POP  Full            Yes       Yes                                         +---------+---------------+---------+-----------+----------+-------------------+  PTV       Full                                                                  +---------+---------------+---------+-----------+----------+-------------------+  PERO                                                       Not well visualized  +---------+---------------+---------+-----------+----------+-------------------+   +---------+---------------+---------+-----------+----------+-------------------+  LEFT      Compressibility Phasicity Spontaneity Properties Thrombus Aging       +---------+---------------+---------+-----------+----------+-------------------+  CFV       Full            Yes       Yes                                         +---------+---------------+---------+-----------+----------+-------------------+  SFJ       Full                                                                  +---------+---------------+---------+-----------+----------+-------------------+  FV  Prox   Full                                                                  +---------+---------------+---------+-----------+----------+-------------------+  FV Mid                    Yes       Yes                                         +---------+---------------+---------+-----------+----------+-------------------+  FV Distal                 Yes       Yes                                         +---------+---------------+---------+-----------+----------+-------------------+  PFV       Full                                                                  +---------+---------------+---------+-----------+----------+-------------------+  POP       Full            Yes       Yes                                         +---------+---------------+---------+-----------+----------+-------------------+  PTV       Full                                                                  +---------+---------------+---------+-----------+----------+-------------------+  PERO                                                       Not well visualized  +---------+---------------+---------+-----------+----------+-------------------+     Summary: BILATERAL: - No evidence of deep vein thrombosis seen in the lower extremities, bilaterally. -No evidence of popliteal cyst, bilaterally.   *See table(s) above for measurements and observations. Electronically signed by Monica Martinez MD on 08/19/2021 at 11:32:34 AM.    Final     Procedures Procedures    Medications Ordered in ED Medications - No data to display  ED Course/ Medical Decision Making/ A&P                           Medical Decision Making  Mahonri Milhouse is a 51 y.o. male with a past medical history significant for hypertension, report of previous atrial fibrillation, and documentation of previous polysubstance abuse who presents with worsening peripheral edema and new exertional shortness of breath.  According to patient, he has had some mild edema for the last few  months but has been worsening over the last few weeks.  He reports that he was briefly on Lasix but ran out of it a couple weeks ago and the swelling has rapidly been worsening.  He reports the swelling is now in both his legs and into his hands at times.  He says that over the last week or so he had worsened exertional shortness of breath when he does anything.  This is new for him.  He denies any chest pain but does report some tightness when he is trying to walk around.  He does report getting diaphoretic with exertion.  He denies nausea, vomiting, constipation, or diarrhea.  He does report he has had urinary urgency but urinating less.  He denies any abdominal pain or back pain or flank pain.  He is reporting some discomfort in his medial thighs going down towards his calves bilaterally which he thinks is due to the swelling.  He reports he continues to smoke tobacco and denies other complaints at this time.  He specifically denies fevers, chills or congestion but does report he had a productive cough for "a while".  On exam, patient does have rales in the bases bilaterally.  Chest and back were nontender.  I did not appreciate a murmur.  Abdomen was nontender.  Patient has marked pitting edema in both legs going up past his knees and  patient subjectively thinks his hands are slightly edematous although I cannot appreciate pitting edema on them.  He had intact pulses in all extremities and was moving them normally.  Patient had normal gait.  EKG shows a sinus rhythm so he is not in A. fib as he reports has been in the past.  No STEMI seen.  Clinically I am concerned that patient is having worsening fluid overload leading to this new exertional shortness of breath.  We will ambulate patient with pulse oximetry to see if he gets hypoxic as this likely elicits the symptoms.  We will also get work-up to look for a kidney abnormality leading to this edema versus heart failure.  He denies any history of heart  failure but reports his grandfather had significant heart failure and died in his 96s from it.  We will get chest x-ray, labs, and monitor patient.  With the discomfort, we will get DVT ultrasounds however given his lack of acute chest pain, and no pleuritic symptoms I have less suspicion that he has pulmonary embolism at this time.  The patient does not get hypoxic and work-up does not show anything else concerning, patient may be candidate for restarting diuretics and outpatient close follow-up however if he gets hypoxic or symptoms continue to worsen, patient may need admission for I suspect IV diuresis.  2:04 PM Work-up is returned.  Patient was able to ambulate without any hypoxia and he is feeling better he reports.  His troponin was negative x2 and his BNP was slightly elevated in the 130s.  He has been walking around the department without any difficulty and his other work-up was similar to prior and reassuring.  Hemoglobin was slightly more decreased fortunately follow-up with the PCP.  We discussed the options of admission given his new exertional shortness of breath and slight fluid overload on work-up and exam however patient would like to do a trial of restarting Lasix at home and follow-up with cardiology and PCP.  Given his normal oxygen saturations and otherwise reassuring work-up this is reasonable.  Patient will be prescribed course of Lasix and will be discharged home.  Potassium is in the normal range and anticipate he will need repeat labs including hemoglobin when he sees PCP and cardiology.  His urine also did show urinary tract infection given his urgency and decreased urine and will give prescription for Keflex.  Patient agrees and will be discharged for follow-up.        Final Clinical Impression(s) / ED Diagnoses Final diagnoses:  Peripheral edema  Exertional shortness of breath  Acute cystitis without hematuria    Rx / DC Orders ED Discharge Orders           Ordered    furosemide (LASIX) 20 MG tablet  Daily        08/19/21 1406    Ambulatory referral to Cardiology        08/19/21 1407    cephALEXin (KEFLEX) 500 MG capsule  4 times daily        08/19/21 1409            Clinical Impression: 1. Peripheral edema   2. Exertional shortness of breath   3. Acute cystitis without hematuria     Disposition: Discharge  Condition: Good  I have discussed the results, Dx and Tx plan with the pt(& family if present). He/she/they expressed understanding and agree(s) with the plan. Discharge instructions discussed at great length. Strict return precautions discussed and pt &/or family  have verbalized understanding of the instructions. No further questions at time of discharge.    New Prescriptions   CEPHALEXIN (KEFLEX) 500 MG CAPSULE    Take 1 capsule (500 mg total) by mouth 4 (four) times daily for 7 days.   FUROSEMIDE (LASIX) 20 MG TABLET    Take 1 tablet (20 mg total) by mouth daily.    Follow Up: [facility] [place] [phone] [place] EMERGENCY DEPARTMENT [address] [id number] [address] [place] [facility] [address] [place] [phone] Schedule an appointment as soon as possible for a visit        Spike Desilets, Gwenyth Allegra, MD 08/19/21 1410

## 2021-08-19 NOTE — Progress Notes (Signed)
Lower extremity venous has been completed.   Preliminary results in CV Proc.   Eric Chang Eric Chang 08/19/2021 11:25 AM

## 2021-08-19 NOTE — ED Triage Notes (Signed)
Pt c/o leg swelling for several weeks, was started on lasix but finished the amount he was given. Doesn't have PCP. Swelling and redness noted from feet up to knees. Denies CP, reports exertional SOB

## 2021-08-21 LAB — URINE CULTURE: Culture: 80000 — AB

## 2021-08-22 ENCOUNTER — Telehealth (HOSPITAL_BASED_OUTPATIENT_CLINIC_OR_DEPARTMENT_OTHER): Payer: Self-pay | Admitting: *Deleted

## 2021-08-22 NOTE — Telephone Encounter (Signed)
Post ED Visit - Positive Culture Follow-up  Culture report reviewed by antimicrobial stewardship pharmacist: [facility] []  , Pharm.D. []  Enzo Bi, Pharm.D., BCPS AQ-ID []  , Pharm.D., BCPS []  Celedonio Miyamoto, Pharm.D., BCPS []  [place], Garvin Fila.D., BCPS, AAHIVP []  , Pharm.D., BCPS, AAHIVP []  Georgina Pillion, PharmD, BCPS []  , PharmD, BCPS []  [place], PharmD, BCPS []  [address], PharmD []  , PharmD, BCPS [x]  Estella Husk,  PharmD  Pharmacy Team []  Lysle Pearl, PharmD []  , PharmD []  Phillips Climes, PharmD []  , Rph []  Agapito Games) , PharmD []  Verlan Friends, PharmD []  , PharmD []  Mervyn Gay, PharmD []  , PharmD []  Peter Minium, PharmD []  [facility], PharmD []  , PharmD []  Len Childs, PharmD   Positive urine culture Treated with Cephalexin, organism sensitive to the same and no further patient follow-up is required at this time.  08/22/2021, 11:46 AM

## 2022-01-03 ENCOUNTER — Ambulatory Visit (INDEPENDENT_AMBULATORY_CARE_PROVIDER_SITE_OTHER): Payer: 59

## 2022-01-03 ENCOUNTER — Ambulatory Visit (INDEPENDENT_AMBULATORY_CARE_PROVIDER_SITE_OTHER): Payer: 59 | Admitting: Physician Assistant

## 2022-01-03 DIAGNOSIS — M25561 Pain in right knee: Secondary | ICD-10-CM

## 2022-01-03 NOTE — Progress Notes (Signed)
Office Visit Note   Patient: Eric Chang           Date of Birth: [birth year]           MRN: [id number] Visit Date: 01/03/2022              Requested by: No referring provider defined for this encounter. PCP: Darryll Capers, PA-C  Chief Complaint  Patient presents with   Right Knee - Pain      HPI: Patient is a pleasant 51 year old gentleman with a 9-day history of right knee locking catching and popping.  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  He felt an immediate pop and began very painful to apply weight.  He has never had any surgeries on his knees.  He did take a day off from work several months ago because his knees hurt.  He knows he has some arthritis in his knee and he can tolerate this but the locking catching and swelling in his right knee began after his work-related injury.  He just wants his right knee "fixed "  Assessment & Plan: Visit Diagnoses:  1. Acute pain of right knee     Plan: Patient had an MRI that demonstrated a complex tear of the 7 mm of the posterior horn of the medial meniscus close to the root.  He also had some mild to moderate arthritis.  He had a significant effusion today which was not present before the injury.  The locking and catching are consistent with a medial meniscus tear and I was read able to reproduce the catching today.  Most of his pain is on the medial joint line.  I will have him follow-up with Dr. Roda Shutters or Dr. August Saucer.  He understands that he does have some arthritis in his knee but in order to address the meniscus tear he would need an arthroscopy.  I offered to aspirate his knee today but he declined  Follow-Up Instructions: No follow-ups on file.   Ortho Exam  Patient is alert, oriented, no adenopathy, well-dressed, normal affect, normal respiratory effort. Right knee he does have a moderate effusion.  He has pain with terminal extension and flexion.  Good endpoint on Lachman  testing he does have pain over the medial and posterior medial joint line.  With range of motion there is a catching sensation that is painful to him  Imaging: XR KNEE 3 VIEW RIGHT  Result Date: 01/03/2022 Radiographs of his right knee were reviewed today.  He does have some medial joint space narrowing and narrowing in the patellofemoral joint with some sclerotic changes.  No acute bony fractures  No images are attached to the encounter.  Labs: Lab Results  Component Value Date   REPTSTATUS 08/21/2021 FINAL 08/19/2021   CULT (A) 08/19/2021    80,000 COLONIES/mL ESCHERICHIA COLI 60,000 COLONIES/mL VIRIDANS STREPTOCOCCUS    LABORGA ESCHERICHIA COLI (A) 08/19/2021     Lab Results  Component Value Date   ALBUMIN 3.6 10/02/2018   ALBUMIN 3.2 (L) 09/27/2018   ALBUMIN 3.8 11/13/2015    No results found for: MG No results found for: VD25OH  No results found for: PREALBUMIN    Latest Ref Rng & Units 08/19/2021    6:42 AM 10/02/2018    3:01 PM 09/27/2018    3:29 PM  CBC EXTENDED  WBC 4.0 - 10.5 K/uL 7.4   6.6   5.7  RBC 4.22 - 5.81 MIL/uL 4.14   4.69   4.51    Hemoglobin 13.0 - 17.0 g/dL 40.9   81.1   91.4    HCT 39.0 - 52.0 % 34.0   42.9   40.6    Platelets 150 - 400 K/uL 246   225   194    NEUT# 1.7 - 7.7 K/uL  3.7   3.0    Lymph# 0.7 - 4.0 K/uL  1.9   2.0       There is no height or weight on file to calculate BMI.  Orders:  Orders Placed This Encounter  Procedures   XR KNEE 3 VIEW RIGHT   No orders of the defined types were placed in this encounter.    Procedures: No procedures performed  Clinical Data: No additional findings.  ROS:  All other systems negative, except as noted in the HPI. Review of Systems  Objective: Vital Signs: There were no vitals taken for this visit.  Specialty Comments:  No specialty comments available.  PMFS History: Patient Active Problem List   Diagnosis Date Noted   Acute encephalopathy 11/07/2015   Overdose 11/07/2015    Polysubstance abuse (HCC)    Essential hypertension    Tobacco abuse    Past Medical History:  Diagnosis Date   Hypertension    Polysubstance abuse (HCC)    Tobacco abuse    UTI (lower urinary tract infection)     No family history on file.  Past Surgical History:  Procedure Laterality Date   BACK SURGERY     Social History   Occupational History   Not on file  Tobacco Use   Smoking status: Every Day    Packs/day: 1.00    Years: 30.00    Pack years: 30.00    Types: Cigarettes   Smokeless tobacco: Former  Building services engineer Use: Never used  Substance and Sexual Activity   Alcohol use: No   Drug use: Not Currently    Types: IV   Sexual activity: Not on file

## 2022-01-09 ENCOUNTER — Ambulatory Visit (INDEPENDENT_AMBULATORY_CARE_PROVIDER_SITE_OTHER): Payer: 59 | Admitting: Orthopaedic Surgery

## 2022-01-09 DIAGNOSIS — S83241A Other tear of medial meniscus, current injury, right knee, initial encounter: Secondary | ICD-10-CM | POA: Diagnosis not present

## 2022-01-09 NOTE — Progress Notes (Signed)
Office Visit Note   Patient: Eric Chang           Date of Birth: 13-Jun-1971           MRN: [id number] Visit Date: 01/09/2022              Requested by: Darryll Capers, PA-C [address] [place],  [place] [address] PCP: Darryll Capers, PA-C   Assessment & Plan: Visit Diagnoses:  1. Acute medial meniscus tear of right knee, initial encounter     Plan: Impression is acute right knee medial meniscus tear.  Based on findings and report of what happened I think this is fairly likely that the meniscus tear as a result of the injury that occurred at work.  The MRI was reviewed with Tasia Catchings and I have recommended arthroscopic medial meniscal debridement and chondroplasty as indicated.  He is not reporting any real symptoms related to his arthritis.  Eunice Blase will call the patient to set up surgery.  Anticipate that he will be out of work for about 2 weeks after surgery.  Follow-Up Instructions: No follow-ups on file.   Orders:  No orders of the defined types were placed in this encounter.  No orders of the defined types were placed in this encounter.     Procedures: No procedures performed   Clinical Data: No additional findings.   Subjective: Chief Complaint  Patient presents with   Right Knee - Pain    HPI Eric Chang is a 51 year old gentleman referral from [facility] for surgical consultation for acute medial meniscus tear right knee.  This occurred at work on March 9.  He stepped and twisted and heard a pop in his knees.  He states that the pain is significantly different than his chronic arthritic pain that he had experienced.  As a result of the injury he has developed swelling and giving way left hip with locking and popping.  He has been terminated by his employer.  He had an MRI done at [facility] which showed an acute medial meniscus tear.  Review of Systems  Constitutional: Negative.   All other systems reviewed and are negative.   Objective: Vital Signs: There  were no vitals taken for this visit.  Physical Exam Vitals and nursing note reviewed.  Constitutional:      Appearance: He is well-developed.  HENT:     Head: Normocephalic and atraumatic.  Eyes:     Pupils: Pupils are equal, round, and reactive to light.  Pulmonary:     Effort: Pulmonary effort is normal.  Abdominal:     Palpations: Abdomen is soft.  Musculoskeletal:        General: Normal range of motion.     Cervical back: Neck supple.  Skin:    General: Skin is warm.  Neurological:     Mental Status: He is alert and oriented to person, place, and time.  Psychiatric:        Behavior: Behavior normal.        Thought Content: Thought content normal.        Judgment: Judgment normal.    Ortho Exam Examination of the right knee shows a large joint effusion with medial joint tenderness and pain with McMurray's test.  Collaterals and cruciates are stable.  1+ crepitus with range of motion.  Specialty Comments:  No specialty comments available.  Imaging: No results found.   PMFS History: Patient Active Problem List   Diagnosis Date Noted   Acute medial meniscus tear  of right knee 01/09/2022   Acute encephalopathy 11/07/2015   Overdose 11/07/2015   Polysubstance abuse (HCC)    Essential hypertension    Tobacco abuse    Past Medical History:  Diagnosis Date   Hypertension    Polysubstance abuse (HCC)    Tobacco abuse    UTI (lower urinary tract infection)     No family history on file.  Past Surgical History:  Procedure Laterality Date   BACK SURGERY     Social History   Occupational History   Not on file  Tobacco Use   Smoking status: Every Day    Packs/day: 1.00    Years: 30.00    Pack years: 30.00    Types: Cigarettes   Smokeless tobacco: Former  Building services engineer Use: Never used  Substance and Sexual Activity   Alcohol use: No   Drug use: Not Currently    Types: IV   Sexual activity: Not on file

## 2022-01-18 ENCOUNTER — Other Ambulatory Visit: Payer: Self-pay

## 2022-01-28 ENCOUNTER — Encounter (HOSPITAL_BASED_OUTPATIENT_CLINIC_OR_DEPARTMENT_OTHER): Payer: Self-pay | Admitting: Orthopaedic Surgery

## 2022-01-28 ENCOUNTER — Other Ambulatory Visit: Payer: Self-pay

## 2022-02-06 ENCOUNTER — Ambulatory Visit (HOSPITAL_BASED_OUTPATIENT_CLINIC_OR_DEPARTMENT_OTHER): Payer: 59 | Admitting: Anesthesiology

## 2022-02-06 ENCOUNTER — Encounter (HOSPITAL_BASED_OUTPATIENT_CLINIC_OR_DEPARTMENT_OTHER)
Admission: RE | Disposition: A | Discharge status: Home / Self Care | Payer: Self-pay | Source: Home / Self Care | Attending: Orthopaedic Surgery

## 2022-02-06 ENCOUNTER — Ambulatory Visit (HOSPITAL_BASED_OUTPATIENT_CLINIC_OR_DEPARTMENT_OTHER)
Admission: RE | Admit: 2022-02-06 | Discharge: 2022-02-06 | Disposition: A | Discharge status: Home / Self Care | Payer: 59 | Attending: Orthopaedic Surgery | Admitting: Orthopaedic Surgery

## 2022-02-06 ENCOUNTER — Other Ambulatory Visit: Payer: Self-pay

## 2022-02-06 ENCOUNTER — Encounter: Payer: Self-pay | Admitting: Orthopaedic Surgery

## 2022-02-06 ENCOUNTER — Encounter (HOSPITAL_BASED_OUTPATIENT_CLINIC_OR_DEPARTMENT_OTHER): Payer: Self-pay | Admitting: Orthopaedic Surgery

## 2022-02-06 DIAGNOSIS — X58XXXA Exposure to other specified factors, initial encounter: Secondary | ICD-10-CM | POA: Insufficient documentation

## 2022-02-06 DIAGNOSIS — Z6841 Body Mass Index (BMI) 40.0 and over, adult: Secondary | ICD-10-CM | POA: Insufficient documentation

## 2022-02-06 DIAGNOSIS — M6588 Other synovitis and tenosynovitis, other site: Secondary | ICD-10-CM | POA: Insufficient documentation

## 2022-02-06 DIAGNOSIS — M94261 Chondromalacia, right knee: Secondary | ICD-10-CM | POA: Insufficient documentation

## 2022-02-06 DIAGNOSIS — F17210 Nicotine dependence, cigarettes, uncomplicated: Secondary | ICD-10-CM

## 2022-02-06 DIAGNOSIS — I10 Essential (primary) hypertension: Secondary | ICD-10-CM | POA: Insufficient documentation

## 2022-02-06 DIAGNOSIS — M659 Synovitis and tenosynovitis, unspecified: Secondary | ICD-10-CM

## 2022-02-06 DIAGNOSIS — Z79899 Other long term (current) drug therapy: Secondary | ICD-10-CM | POA: Diagnosis not present

## 2022-02-06 DIAGNOSIS — S83241A Other tear of medial meniscus, current injury, right knee, initial encounter: Secondary | ICD-10-CM | POA: Diagnosis present

## 2022-02-06 HISTORY — DX: Nicotine dependence, unspecified, uncomplicated: F17.200

## 2022-02-06 HISTORY — DX: Other tear of medial meniscus, current injury, unspecified knee, initial encounter: S83.249A

## 2022-02-06 SURGERY — ARTHROSCOPY, KNEE, WITH MENISCUS REPAIR
Anesthesia: General | Site: Knee | Laterality: Right

## 2022-02-06 MED ORDER — HYDROMORPHONE HCL 1 MG/ML IJ SOLN
INTRAMUSCULAR | Status: AC
  Filled 2022-02-06: qty 0.5

## 2022-02-06 MED ORDER — OXYCODONE HCL 5 MG/5ML PO SOLN: 5.0000 mg | Freq: Once | ORAL | Status: AC | PRN

## 2022-02-06 MED ORDER — MEPERIDINE HCL 25 MG/ML IJ SOLN: 6.2500 mg | INTRAMUSCULAR | Status: DC | PRN

## 2022-02-06 MED ORDER — OXYCODONE HCL 5 MG PO TABS
ORAL_TABLET | ORAL | Status: AC
  Filled 2022-02-06: qty 1

## 2022-02-06 MED ORDER — HYDROMORPHONE HCL 1 MG/ML IJ SOLN
0.2500 mg | INTRAMUSCULAR | Status: DC | PRN
  Administered 2022-02-06: 0.5 mg via INTRAVENOUS

## 2022-02-06 MED ORDER — FENTANYL CITRATE (PF) 100 MCG/2ML IJ SOLN
INTRAMUSCULAR | Status: DC | PRN
  Administered 2022-02-06: 100 ug via INTRAVENOUS

## 2022-02-06 MED ORDER — PROPOFOL 500 MG/50ML IV EMUL
INTRAVENOUS | Status: AC
  Filled 2022-02-06: qty 50

## 2022-02-06 MED ORDER — HYDROCODONE-ACETAMINOPHEN 5-325 MG PO TABS: 1.0000 | ORAL_TABLET | Freq: Every day | ORAL | 0 refills | Status: AC | PRN

## 2022-02-06 MED ORDER — PROPOFOL 10 MG/ML IV BOLUS
INTRAVENOUS | Status: DC | PRN
  Administered 2022-02-06: 300 mg via INTRAVENOUS

## 2022-02-06 MED ORDER — AMISULPRIDE (ANTIEMETIC) 5 MG/2ML IV SOLN: 10.0000 mg | Freq: Once | INTRAVENOUS | Status: DC | PRN

## 2022-02-06 MED ORDER — FENTANYL CITRATE (PF) 100 MCG/2ML IJ SOLN
INTRAMUSCULAR | Status: AC
  Filled 2022-02-06: qty 2

## 2022-02-06 MED ORDER — MIDAZOLAM HCL 2 MG/2ML IJ SOLN
INTRAMUSCULAR | Status: AC
  Filled 2022-02-06: qty 2

## 2022-02-06 MED ORDER — LACTATED RINGERS IV SOLN: INTRAVENOUS | Status: DC

## 2022-02-06 MED ORDER — DEXAMETHASONE SODIUM PHOSPHATE 10 MG/ML IJ SOLN
INTRAMUSCULAR | Status: AC
  Filled 2022-02-06: qty 1

## 2022-02-06 MED ORDER — OXYCODONE HCL 5 MG PO TABS
5.0000 mg | ORAL_TABLET | Freq: Once | ORAL | Status: AC | PRN
  Administered 2022-02-06: 5 mg via ORAL

## 2022-02-06 MED ORDER — ONDANSETRON HCL 4 MG/2ML IJ SOLN
INTRAMUSCULAR | Status: AC
  Filled 2022-02-06: qty 2

## 2022-02-06 MED ORDER — CEFAZOLIN SODIUM-DEXTROSE 2-4 GM/100ML-% IV SOLN
INTRAVENOUS | Status: AC
  Filled 2022-02-06: qty 100

## 2022-02-06 MED ORDER — ONDANSETRON HCL 4 MG PO TABS: 4.0000 mg | ORAL_TABLET | Freq: Three times a day (TID) | ORAL | 0 refills | Status: AC | PRN

## 2022-02-06 MED ORDER — DEXAMETHASONE SODIUM PHOSPHATE 4 MG/ML IJ SOLN
INTRAMUSCULAR | Status: DC | PRN
  Administered 2022-02-06: 10 mg via INTRAVENOUS

## 2022-02-06 MED ORDER — CEFAZOLIN SODIUM-DEXTROSE 1-4 GM/50ML-% IV SOLN
INTRAVENOUS | Status: AC
  Filled 2022-02-06: qty 50

## 2022-02-06 MED ORDER — SODIUM CHLORIDE 0.9 % IR SOLN
Status: DC | PRN
  Administered 2022-02-06: 9000 mL

## 2022-02-06 MED ORDER — BUPIVACAINE HCL (PF) 0.25 % IJ SOLN
INTRAMUSCULAR | Status: DC | PRN
  Administered 2022-02-06: 20 mL

## 2022-02-06 MED ORDER — MIDAZOLAM HCL 5 MG/5ML IJ SOLN
INTRAMUSCULAR | Status: DC | PRN
  Administered 2022-02-06: 2 mg via INTRAVENOUS

## 2022-02-06 MED ORDER — CEFAZOLIN IN SODIUM CHLORIDE 3-0.9 GM/100ML-% IV SOLN
3.0000 g | INTRAVENOUS | Status: AC
  Administered 2022-02-06: 3 g via INTRAVENOUS

## 2022-02-06 MED ORDER — LIDOCAINE 2% (20 MG/ML) 5 ML SYRINGE
INTRAMUSCULAR | Status: DC | PRN
  Administered 2022-02-06: 80 mg via INTRAVENOUS

## 2022-02-06 MED ORDER — PROMETHAZINE HCL 25 MG/ML IJ SOLN: 6.2500 mg | INTRAMUSCULAR | Status: DC | PRN

## 2022-02-06 MED ORDER — BUPIVACAINE HCL (PF) 0.5 % IJ SOLN
INTRAMUSCULAR | Status: AC
  Filled 2022-02-06: qty 30

## 2022-02-06 SURGICAL SUPPLY — 36 items
BANDAGE ESMARK 6X9 LF (GAUZE/BANDAGES/DRESSINGS) IMPLANT
BLADE EXCALIBUR 4.0X13 (MISCELLANEOUS) ×1 IMPLANT
BLADE SHAVER TORPEDO 4X13 (MISCELLANEOUS) ×1 IMPLANT
BNDG CMPR 9X6 STRL LF SNTH (GAUZE/BANDAGES/DRESSINGS)
BNDG ELASTIC 6X5.8 VLCR STR LF (GAUZE/BANDAGES/DRESSINGS) ×4 IMPLANT
BNDG ESMARK 6X9 LF (GAUZE/BANDAGES/DRESSINGS)
COOLER ICEMAN CLASSIC (MISCELLANEOUS) ×2 IMPLANT
CUFF TOURN SGL QUICK 34 (TOURNIQUET CUFF) ×2
CUFF TRNQT CYL 34X4.125X (TOURNIQUET CUFF) ×1 IMPLANT
DRAPE ARTHROSCOPY W/POUCH 90 (DRAPES) ×2 IMPLANT
DRAPE IMP U-DRAPE 54X76 (DRAPES) ×2 IMPLANT
DRAPE U-SHAPE 47X51 STRL (DRAPES) ×2 IMPLANT
DURAPREP 26ML APPLICATOR (WOUND CARE) ×2 IMPLANT
GAUZE SPONGE 4X4 12PLY STRL (GAUZE/BANDAGES/DRESSINGS) ×2 IMPLANT
GAUZE XEROFORM 1X8 LF (GAUZE/BANDAGES/DRESSINGS) ×2 IMPLANT
GLOVE ECLIPSE 7.0 STRL STRAW (GLOVE) ×4 IMPLANT
GLOVE INDICATOR 7.0 STRL GRN (GLOVE) ×2 IMPLANT
GLOVE INDICATOR 7.5 STRL GRN (GLOVE) ×2 IMPLANT
GLOVE SURG SS PI 7.0 STRL IVOR (GLOVE) ×2 IMPLANT
GLOVE SURG SYN 7.5  E (GLOVE) ×2
GLOVE SURG SYN 7.5 E (GLOVE) ×1 IMPLANT
GLOVE SURG SYN 7.5 PF PI (GLOVE) ×1 IMPLANT
GOWN STRL REIN XL XLG (GOWN DISPOSABLE) ×2 IMPLANT
GOWN STRL REUS W/ TWL LRG LVL3 (GOWN DISPOSABLE) ×1 IMPLANT
GOWN STRL REUS W/ TWL XL LVL3 (GOWN DISPOSABLE) ×1 IMPLANT
GOWN STRL REUS W/TWL LRG LVL3 (GOWN DISPOSABLE) ×2
GOWN STRL REUS W/TWL XL LVL3 (GOWN DISPOSABLE) ×2
MANIFOLD NEPTUNE II (INSTRUMENTS) ×2 IMPLANT
MAT PREVALON FULL STRYKER (MISCELLANEOUS) ×1 IMPLANT
PACK ARTHROSCOPY DSU (CUSTOM PROCEDURE TRAY) ×2 IMPLANT
PACK BASIN DAY SURGERY FS (CUSTOM PROCEDURE TRAY) ×2 IMPLANT
PAD COLD SHLDR WRAP-ON (PAD) ×2 IMPLANT
SHEET MEDIUM DRAPE 40X70 STRL (DRAPES) ×2 IMPLANT
SUT ETHILON 3 0 PS 1 (SUTURE) ×2 IMPLANT
TOWEL GREEN STERILE FF (TOWEL DISPOSABLE) ×2 IMPLANT
TUBING ARTHROSCOPY IRRIG 16FT (MISCELLANEOUS) ×2 IMPLANT

## 2022-02-06 NOTE — Anesthesia Procedure Notes (Signed)
Procedure Name: LMA Insertion Date/Time: 02/06/2022 11:42 AM  Performed by: Burna Cash, CRNAPre-anesthesia Checklist: Patient identified, Emergency Drugs available, Suction available and Patient being monitored Patient Re-evaluated:Patient Re-evaluated prior to induction Oxygen Delivery Method: Circle system utilized Preoxygenation: Pre-oxygenation with 100% oxygen Induction Type: IV induction Ventilation: Mask ventilation without difficulty LMA: LMA inserted LMA Size: 5.0 Number of attempts: 1 Airway Equipment and Method: Bite block Placement Confirmation: positive ETCO2 Tube secured with: Tape Dental Injury: Teeth and Oropharynx as per pre-operative assessment

## 2022-02-06 NOTE — Discharge Instructions (Addendum)

## 2022-02-06 NOTE — H&P (Signed)
PREOPERATIVE H&P  Chief Complaint: right knee medial meniscal tear  HPI: Eric Chang is a 51 y.o. male who presents for surgical treatment of right knee medial meniscal tear.  He denies any changes in medical history.  Past Medical History:  Diagnosis Date   Hypertension    MMT (medial meniscus tear)    right   Polysubstance abuse (HCC)    Smoker    1ppd   Tobacco abuse    UTI (lower urinary tract infection)    Past Surgical History:  Procedure Laterality Date   BACK SURGERY     x2   Social History   Socioeconomic History   Marital status: Divorced    Spouse name: Not on file   Number of children: Not on file   Years of education: Not on file   Highest education level: Not on file  Occupational History   Not on file  Tobacco Use   Smoking status: Every Day    Packs/day: 1.00    Years: 30.00    Total pack years: 30.00    Types: Cigarettes   Smokeless tobacco: Former  Building services engineer Use: Never used  Substance and Sexual Activity   Alcohol use: No   Drug use: Not Currently    Types: IV   Sexual activity: Not on file  Other Topics Concern   Not on file  Social History Narrative   Not on file   Social Determinants of Health   Financial Resource Strain: Not on file  Food Insecurity: Not on file  Transportation Needs: Not on file  Physical Activity: Not on file  Stress: Not on file  Social Connections: Not on file   History reviewed. No pertinent family history. No Known Allergies Prior to Admission medications   Medication Sig Start Date End Date Taking? Authorizing Provider  amLODipine (NORVASC) 5 MG tablet Take 5 mg by mouth daily.   Yes [provider]  Cholecalciferol (VITAMIN D3) 50 MCG (2000 UT) capsule Take 2,000 Units by mouth daily. 08/03/21  Yes [provider]  DULoxetine (CYMBALTA) 30 MG capsule Take 30 mg by mouth 2 (two) times daily. 08/03/21  Yes [provider]  ibuprofen (ADVIL) 200 MG tablet Take  800 mg by mouth every 6 (six) hours as needed for mild pain or headache.   Yes [provider]  oxycodone (OXY-IR) 5 MG capsule Take 5 mg by mouth every 4 (four) hours as needed.   Yes [provider]     Positive ROS: All other systems have been reviewed and were otherwise negative with the exception of those mentioned in the HPI and as above.  Physical Exam: General: Alert, no acute distress Cardiovascular: No pedal edema Respiratory: No cyanosis, no use of accessory musculature GI: abdomen soft Skin: No lesions in the area of chief complaint Neurologic: Sensation intact distally Psychiatric: Patient is competent for consent with normal mood and affect Lymphatic: no lymphedema  MUSCULOSKELETAL: exam stable  Assessment: right knee medial meniscal tear  Plan: Plan for Procedure(s): right knee partial medial meniscectomy  The risks benefits and alternatives were discussed with the patient including but not limited to the risks of nonoperative treatment, versus surgical intervention including infection, bleeding, nerve injury,  blood clots, cardiopulmonary complications, morbidity, mortality, among others, and they were willing to proceed.   Preoperative templating of the joint replacement has been completed, documented, and submitted to the Operating Room personnel in order to optimize intra-operative equipment management.  Glee Arvin, MD 02/06/2022 9:52 AM

## 2022-02-06 NOTE — Transfer of Care (Signed)
Immediate Anesthesia Transfer of Care Note  Patient: Eric Chang  Procedure(s) Performed: right knee partial medial meniscectomy (Right: Knee)  Patient Location: PACU  Anesthesia Type:General  Level of Consciousness: awake, alert  and oriented  Airway & Oxygen Therapy: Patient Spontanous Breathing and Patient connected to face mask oxygen  Post-op Assessment: Report given to RN and Post -op Vital signs reviewed and stable  Post vital signs: Reviewed and stable  Last Vitals:  Vitals Value Taken Time  BP 143/93 02/06/22 1235  Temp    Pulse 64 02/06/22 1237  Resp 13 02/06/22 1237  SpO2 100 % 02/06/22 1237  Vitals shown include unvalidated device data.  Last Pain:  Vitals:   02/06/22 1016  TempSrc: Oral  PainSc: 6       Patients Stated Pain Goal: 3 (02/06/22 1016)  Complications: No notable events documented.

## 2022-02-06 NOTE — Anesthesia Preprocedure Evaluation (Addendum)
Anesthesia Evaluation  Patient identified by MRN, date of birth, ID band Patient awake    Reviewed: Allergy & Precautions, NPO status , Patient's Chart, lab work & pertinent test results  Airway Mallampati: II  TM Distance: >3 FB Neck ROM: Full    Dental  (+) Edentulous Upper   Pulmonary neg pulmonary ROS, Current Smoker and Patient abstained from smoking.,    Pulmonary exam normal breath sounds clear to auscultation       Cardiovascular hypertension, Pt. on medications negative cardio ROS Normal cardiovascular exam Rhythm:Regular Rate:Normal     Neuro/Psych negative neurological ROS  negative psych ROS   GI/Hepatic negative GI ROS, Neg liver ROS,   Endo/Other  Morbid obesity  Renal/GU negative Renal ROS  negative genitourinary   Musculoskeletal negative musculoskeletal ROS (+)   Abdominal (+) + obese,   Peds negative pediatric ROS (+)  Hematology negative hematology ROS (+)   Anesthesia Other Findings   Reproductive/Obstetrics negative OB ROS                            Anesthesia Physical Anesthesia Plan  ASA: 3  Anesthesia Plan: General   Post-op Pain Management: Dilaudid IV   Induction: Intravenous  PONV Risk Score and Plan: 1 and Ondansetron and Treatment may vary due to age or medical condition  Airway Management Planned: LMA  Additional Equipment:   Intra-op Plan:   Post-operative Plan: Extubation in OR  Informed Consent: I have reviewed the patients History and Physical, chart, labs and discussed the procedure including the risks, benefits and alternatives for the proposed anesthesia with the patient or authorized representative who has indicated his/her understanding and acceptance.     Dental advisory given  Plan Discussed with: CRNA  Anesthesia Plan Comments:         Anesthesia Quick Evaluation

## 2022-02-06 NOTE — Anesthesia Postprocedure Evaluation (Signed)
Anesthesia Post Note  Patient: Eric Chang  Procedure(s) Performed: right knee partial medial meniscectomy (Right: Knee)     Patient location during evaluation: PACU Anesthesia Type: General Level of consciousness: awake and alert Pain management: pain level controlled Vital Signs Assessment: post-procedure vital signs reviewed and stable Respiratory status: spontaneous breathing, nonlabored ventilation and respiratory function stable Cardiovascular status: blood pressure returned to baseline and stable Postop Assessment: no apparent nausea or vomiting Anesthetic complications: no   No notable events documented.  Last Vitals:  Vitals:   02/06/22 1315 02/06/22 1334  BP:  (!) 128/96  Pulse: (!) 55 62  Resp: 12 16  Temp:  36.4 C  SpO2: 95% 97%    Last Pain:  Vitals:   02/06/22 1334  TempSrc: Oral  PainSc: 3                  Lowella Curb

## 2022-02-06 NOTE — Op Note (Signed)
   Surgery Date: 02/06/2022  PREOPERATIVE DIAGNOSES:  1.  Right knee medial meniscus tear 2.  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 Right knee arthroscopy with major synovectomy 2. Right knee arthroscopy with arthroscopic partial medial meniscectomy 3. Right knee arthroscopy with arthroscopic chondroplasty medial femoral condyle and patella  SURGEON: N. Glee Arvin, M.D.  ASSIST: Starlyn Skeans [place], [place]; necessary for the timely completion of procedure and due to complexity of procedure.  ANESTHESIA:  general  FLUIDS: Per anesthesia record.   ESTIMATED BLOOD LOSS: minimal  * No implants in log *  DESCRIPTION OF PROCEDURE: Mr. Eric Chang is a 51 y.o.-year-old male with above mentioned conditions. Full discussion held regarding risks benefits alternatives and complications related surgical intervention. Conservative care options reviewed. All questions answered.  The patient was identified in the preoperative holding area and the operative extremity was marked. The patient was brought to the operating room and transferred to operating table in a supine position. Satisfactory general anesthesia was induced by anesthesiology.    Standard anterolateral, anteromedial arthroscopy portals were obtained. The anteromedial portal was obtained with a spinal needle for localization under direct visualization with subsequent diagnostic findings.   There was extensive synovitis throughout the knee.  Major synovectomy was performed with a shaver.  Grade 3 changes were noted in the medial compartment.  Valgus force was placed on the knee and medial meniscal tear of the posterior horn and the meniscal root was encountered.  The meniscus was debrided back to stable margins with a shaver and meniscus basket.  Gentle chondroplasty was performed the medial compartment.  Cruciates were unremarkable.  Leg was placed in figure-of-four  position.  Grade 1 changes were noted in the lateral compartment.  Mild degenerative fraying of the lateral meniscus without any frank tears.  The knee was then placed in extension and grade 2 changes were noted on the patellar and trochlear surface.  Gentle chondroplasty was performed on the posterior surface of the patella.  Gutters were checked for loose bodies.  Excess fluid was removed from the knee joint.  Incisions were closed with interrupted nylon sutures.  Sterile dressings were applied.  Patient tolerated procedure well had no immediate complications.  Suprapatellar pouch and gutters: moderate synovitis or debris. Patella chondral surface: Grade 2 Trochlear chondral surface: Grade 2 Patellofemoral tracking: normal Medial meniscus: complex tear posterior horn and root.  Medial femoral condyle weight bearing surface: Grade 3 Medial tibial plateau: Grade 3 Anterior cruciate ligament:stable Posterior cruciate ligament:stable Lateral meniscus: mild degenerative fraying.   Lateral femoral condyle weight bearing surface: Grade 1 Lateral tibial plateau: Grade 1  DISPOSITION: The patient was awakened from general anesthetic, extubated, taken to the recovery room in medically stable condition, no apparent complications. The patient may be weightbearing as tolerated to the operative lower extremity.  Range of motion of right knee as tolerated.  Mayra Reel, MD [facility] 12:23 PM

## 2022-02-07 ENCOUNTER — Encounter (HOSPITAL_BASED_OUTPATIENT_CLINIC_OR_DEPARTMENT_OTHER): Payer: Self-pay | Admitting: Orthopaedic Surgery

## 2022-02-13 ENCOUNTER — Ambulatory Visit (INDEPENDENT_AMBULATORY_CARE_PROVIDER_SITE_OTHER): Payer: 59 | Admitting: Orthopaedic Surgery

## 2022-02-13 ENCOUNTER — Encounter: Payer: Self-pay | Admitting: Orthopaedic Surgery

## 2022-02-13 DIAGNOSIS — S83241A Other tear of medial meniscus, current injury, right knee, initial encounter: Secondary | ICD-10-CM

## 2022-02-13 NOTE — Progress Notes (Signed)
   Post-Op Visit Note   Patient: Eric Chang           Date of Birth: September 10, 1970           MRN: [id number] Visit Date: 02/13/2022 PCP: Darryll Capers, PA-C   Assessment & Plan:  Chief Complaint:  Chief Complaint  Patient presents with   Right Knee - Follow-up, Routine Post Op    Right knee arthroscopy 02/06/2022   Visit Diagnoses:  1. Acute medial meniscus tear of right knee, initial encounter     Plan: [name] is 1 week status post knee scope medial meniscal debridement.  Doing well overall.  No real complaints.  Examination right knee shows healed surgical incisions.  Trace effusion.  Range of motion is adequate.  No signs of infection.  Arthroscopic photos reviewed with the patient.  We remove the sutures.  Provided home exercises.  Increase activity as tolerated.  Recheck in 3 weeks.  Remain out of work until follow-up.  Works as a Museum/gallery exhibitions officer.  Follow-Up Instructions: Return in about 3 weeks (around 03/06/2022).   Orders:  No orders of the defined types were placed in this encounter.  No orders of the defined types were placed in this encounter.   Imaging: No results found.  PMFS History: Patient Active Problem List   Diagnosis Date Noted   Acute medial meniscus tear of right knee 01/09/2022   Acute encephalopathy 11/07/2015   Overdose 11/07/2015   Polysubstance abuse (HCC)    Essential hypertension    Tobacco abuse    Past Medical History:  Diagnosis Date   Hypertension    MMT (medial meniscus tear)    right   Polysubstance abuse (HCC)    Smoker    1ppd   Tobacco abuse    UTI (lower urinary tract infection)     No family history on file.  Past Surgical History:  Procedure Laterality Date   BACK SURGERY     x2   KNEE ARTHROSCOPY WITH MENISCAL REPAIR Right 02/06/2022   Procedure: right knee partial medial meniscectomy;  Surgeon: Tarry Kos, MD;  Location: [facility];  Service: Orthopedics;  Laterality: Right;   Social History    Occupational History   Not on file  Tobacco Use   Smoking status: Every Day    Packs/day: 1.00    Years: 30.00    Total pack years: 30.00    Types: Cigarettes   Smokeless tobacco: Former  Building services engineer Use: Never used  Substance and Sexual Activity   Alcohol use: No   Drug use: Not Currently    Types: IV   Sexual activity: Not on file

## 2022-03-02 ENCOUNTER — Emergency Department (HOSPITAL_COMMUNITY)
Admission: EM | Admit: 2022-03-02 | Discharge: 2022-03-03 | Disposition: A | Discharge status: Home / Self Care | Payer: 59 | Attending: Emergency Medicine | Admitting: Emergency Medicine

## 2022-03-02 ENCOUNTER — Encounter (HOSPITAL_COMMUNITY): Payer: Self-pay

## 2022-03-02 ENCOUNTER — Emergency Department (HOSPITAL_COMMUNITY): Payer: 59

## 2022-03-02 ENCOUNTER — Other Ambulatory Visit: Payer: Self-pay

## 2022-03-02 DIAGNOSIS — I10 Essential (primary) hypertension: Secondary | ICD-10-CM | POA: Insufficient documentation

## 2022-03-02 DIAGNOSIS — R109 Unspecified abdominal pain: Secondary | ICD-10-CM | POA: Diagnosis present

## 2022-03-02 DIAGNOSIS — N200 Calculus of kidney: Secondary | ICD-10-CM | POA: Insufficient documentation

## 2022-03-02 DIAGNOSIS — F17210 Nicotine dependence, cigarettes, uncomplicated: Secondary | ICD-10-CM | POA: Diagnosis not present

## 2022-03-02 LAB — URINALYSIS, ROUTINE W REFLEX MICROSCOPIC
Bilirubin Urine: NEGATIVE
Glucose, UA: NEGATIVE mg/dL
Ketones, ur: NEGATIVE mg/dL
Nitrite: NEGATIVE
Protein, ur: NEGATIVE mg/dL
RBC / HPF: >50 RBC/hpf — ABNORMAL HIGH (ref 0–5)
Specific Gravity, Urine: 1.017 (ref 1.005–1.030)
WBC, UA: >50 WBC/hpf — ABNORMAL HIGH (ref 0–5)
pH: 6 (ref 5.0–8.0)

## 2022-03-02 MED ORDER — HYDROMORPHONE HCL 1 MG/ML IJ SOLN
1.0000 mg | Freq: Once | INTRAMUSCULAR | Status: AC
  Administered 2022-03-03: 1 mg via INTRAVENOUS
  Filled 2022-03-02: qty 1

## 2022-03-02 MED ORDER — KETOROLAC TROMETHAMINE 15 MG/ML IJ SOLN
15.0000 mg | Freq: Once | INTRAMUSCULAR | Status: AC
  Administered 2022-03-03: 15 mg via INTRAVENOUS
  Filled 2022-03-02: qty 1

## 2022-03-02 MED ORDER — SODIUM CHLORIDE 0.9 % IV BOLUS
1000.0000 mL | Freq: Once | INTRAVENOUS | Status: AC
  Administered 2022-03-03: 1000 mL via INTRAVENOUS

## 2022-03-02 NOTE — ED Triage Notes (Addendum)
Pt reports with left sided flank pain that started around 5 pm. Pt states that it feels like he has to urinate really bad and hasn't used the bathroom since around 5 pm. Pt states that he had knee surgery a week ago.

## 2022-03-02 NOTE — ED Provider Notes (Signed)
[facility]-EMERGENCY DEPT [facility] Emergency Department Provider Note MRN:  [id number]  Arrival date & time: 03/03/22     Chief Complaint   Flank Pain   History of Present Illness   Eric Chang is a 51 y.o. year-old male with a history of hypertension presenting to the ED with chief complaint of flank pain.  Left-sided flank pain with radiation into the left lower quadrant.  Associated with nausea vomiting, urinary hesitancy.  No fever, pain is severe.  Review of Systems  A thorough review of systems was obtained and all systems are negative except as noted in the HPI and PMH.   Patient's Health History    Past Medical History:  Diagnosis Date   Hypertension    MMT (medial meniscus tear)    right   Polysubstance abuse (HCC)    Smoker    1ppd   Tobacco abuse    UTI (lower urinary tract infection)     Past Surgical History:  Procedure Laterality Date   BACK SURGERY     x2   KNEE ARTHROSCOPY WITH MENISCAL REPAIR Right 02/06/2022   Procedure: right knee partial medial meniscectomy;  Surgeon: Tarry Kos, MD;  Location: [facility];  Service: Orthopedics;  Laterality: Right;    History reviewed. No pertinent family history.  Social History   Socioeconomic History   Marital status: Divorced    Spouse name: Not on file   Number of children: Not on file   Years of education: Not on file   Highest education level: Not on file  Occupational History   Not on file  Tobacco Use   Smoking status: Every Day    Packs/day: 1.00    Years: 30.00    Total pack years: 30.00    Types: Cigarettes   Smokeless tobacco: Former  Building services engineer Use: Never used  Substance and Sexual Activity   Alcohol use: No   Drug use: Not Currently    Types: IV   Sexual activity: Not on file  Other Topics Concern   Not on file  Social History Narrative   Not on file   Social Determinants of Health   Financial Resource Strain: Not on file  Food Insecurity: Not on  file  Transportation Needs: Not on file  Physical Activity: Not on file  Stress: Not on file  Social Connections: Not on file  Intimate Partner Violence: Not on file     Physical Exam   Vitals:   03/02/22 2245 03/03/22 0130  BP: (!) 156/99 (!) 148/81  Pulse: 81 83  Resp: 18 18  Temp: 98.9 F (37.2 C)   SpO2: 100% 94%    CONSTITUTIONAL: Well-appearing, in moderate distress due to pain NEURO/PSYCH:  Alert and oriented x 3, no focal deficits EYES:  eyes equal and reactive ENT/NECK:  no LAD, no JVD CARDIO: Regular rate, well-perfused, normal S1 and S2 PULM:  CTAB no wheezing or rhonchi GI/GU:  non-distended, non-tender MSK/SPINE:  No gross deformities, no edema SKIN:  no rash, atraumatic   *Additional and/or pertinent findings included in MDM below  Diagnostic and Interventional Summary    EKG Interpretation  Date/Time:    Ventricular Rate:    PR Interval:    QRS Duration:   QT Interval:    QTC Calculation:   R Axis:     Text Interpretation:         Labs Reviewed  URINALYSIS, ROUTINE W REFLEX MICROSCOPIC - Abnormal; Notable for the following  components:      Result Value   APPearance HAZY (*)    Hgb urine dipstick MODERATE (*)    Leukocytes,Ua LARGE (*)    RBC / HPF >50 (*)    WBC, UA >50 (*)    Bacteria, UA RARE (*)    All other components within normal limits  CBC WITH DIFFERENTIAL/PLATELET - Abnormal; Notable for the following components:   Hemoglobin 12.9 (*)    All other components within normal limits  COMPREHENSIVE METABOLIC PANEL - Abnormal; Notable for the following components:   Glucose, Bld 107 (*)    Albumin 3.3 (*)    Alkaline Phosphatase 144 (*)    All other components within normal limits  LIPASE, BLOOD  CBC WITH DIFFERENTIAL/PLATELET    CT Renal Stone Study  Final Result      Medications  HYDROmorphone (DILAUDID) injection 1 mg (1 mg Intravenous Given 03/03/22 0029)  ketorolac (TORADOL) 15 MG/ML injection 15 mg (15 mg Intravenous  Given 03/03/22 0028)  sodium chloride 0.9 % bolus 1,000 mL (1,000 mLs Intravenous Bolus 03/03/22 0029)  HYDROmorphone (DILAUDID) injection 1 mg (1 mg Intravenous Given 03/03/22 0124)     Procedures  /  Critical Care Procedures  ED Course and Medical Decision Making  Initial Impression and Ddx Flank pain, abdomen soft and nontender, vital signs normal, appears uncomfortable, clinically suspicious for kidney stone especially given the urinary hesitancy, awaiting labs, CT renal.  Past medical/surgical history that increases complexity of ED encounter: None  Interpretation of Diagnostics I personally reviewed the laboratory assessment and my interpretation is as follows: No significant blood count or electrolyte disturbance.  Urinalysis with large blood, white blood cells  CT confirms small UPJ stone  Patient Reassessment and Ultimate Disposition/Management     Patient feeling better after medications listed above.  No fever, no leukocytosis, does not seem to have an infected stone despite the WBCs in the urine.  Strict return precautions for fever, appropriate for discharge.  Patient management required discussion with the following services or consulting groups:  None  Complexity of Problems Addressed Acute illness or injury that poses threat of life of bodily function  Additional Data Reviewed and Analyzed Further history obtained from: None  Additional Factors Impacting ED Encounter Risk Use of parenteral controlled substances and Consideration of hospitalization  Elmer Sow. Pilar Plate, MD [facility] Emergency Medicine [facility] [email or web address]  Final Clinical Impressions(s) / ED Diagnoses     ICD-10-CM   1. Kidney stone  N20.0       ED Discharge Orders          Ordered    oxyCODONE (ROXICODONE) 5 MG immediate release tablet  Every 4 hours PRN        03/03/22 0242    tamsulosin (FLOMAX) 0.4 MG CAPS capsule  Daily        03/03/22 0242              Discharge Instructions Discussed with and Provided to Patient:     Discharge Instructions      You were evaluated in the Emergency Department and after careful evaluation, we did not find any emergent condition requiring admission or further testing in the hospital.  Your exam/testing today is overall reassuring.  Symptoms seem to be due to a kidney stone.  Use the Flomax daily to help pass the stone.  Follow-up with urology.  Recommend Tylenol 1000 mg every 4-6 hours and/or Motrin 600 mg every 4-6 hours for pain.  You can use the oxycodone medication for more significant pain.  Please return to the Emergency Department if you experience any worsening of your condition.   Thank you for allowing [place] to be a part of your care.       Sabas Sous, MD 03/03/22 [phone]

## 2022-03-03 LAB — COMPREHENSIVE METABOLIC PANEL
ALT: 27 U/L (ref 0–44)
AST: 37 U/L (ref 15–41)
Albumin: 3.3 g/dL — ABNORMAL LOW (ref 3.5–5.0)
Alkaline Phosphatase: 144 U/L — ABNORMAL HIGH (ref 38–126)
Anion gap: 9 (ref 5–15)
BUN: 13 mg/dL (ref 6–20)
CO2: 25 mmol/L (ref 22–32)
Calcium: 9.2 mg/dL (ref 8.9–10.3)
Chloride: 105 mmol/L (ref 98–111)
Creatinine, Ser: 1.13 mg/dL (ref 0.61–1.24)
GFR, Estimated: >60 mL/min (ref >60)
Glucose, Bld: 107 mg/dL — ABNORMAL HIGH (ref 70–99)
Potassium: 4 mmol/L (ref 3.5–5.1)
Sodium: 139 mmol/L (ref 135–145)
Total Bilirubin: 0.5 mg/dL (ref 0.3–1.2)
Total Protein: 7.6 g/dL (ref 6.5–8.1)

## 2022-03-03 LAB — CBC WITH DIFFERENTIAL/PLATELET
Abs Immature Granulocytes: 0.04 10*3/uL (ref 0.00–0.07)
Basophils Absolute: 0 10*3/uL (ref 0.0–0.1)
Basophils Relative: 0 %
Eosinophils Absolute: 0.1 10*3/uL (ref 0.0–0.5)
Eosinophils Relative: 1 %
HCT: 39.9 % (ref 39.0–52.0)
Hemoglobin: 12.9 g/dL — ABNORMAL LOW (ref 13.0–17.0)
Immature Granulocytes: 1 %
Lymphocytes Relative: 10 %
Lymphs Abs: 0.8 10*3/uL (ref 0.7–4.0)
MCH: 28.7 pg (ref 26.0–34.0)
MCHC: 32.3 g/dL (ref 30.0–36.0)
MCV: 88.7 fL (ref 80.0–100.0)
Monocytes Absolute: 0.2 10*3/uL (ref 0.1–1.0)
Monocytes Relative: 2 %
Neutro Abs: 7.3 10*3/uL (ref 1.7–7.7)
Neutrophils Relative %: 86 %
Platelets: 242 10*3/uL (ref 150–400)
RBC: 4.5 MIL/uL (ref 4.22–5.81)
RDW: 13.2 % (ref 11.5–15.5)
WBC: 8.4 10*3/uL (ref 4.0–10.5)
nRBC: 0 % (ref 0.0–0.2)

## 2022-03-03 LAB — LIPASE, BLOOD: Lipase: 19 U/L (ref 11–51)

## 2022-03-03 MED ORDER — OXYCODONE HCL 5 MG PO TABS: 5.0000 mg | ORAL_TABLET | ORAL | 0 refills | Status: AC | PRN

## 2022-03-03 MED ORDER — HYDROMORPHONE HCL 1 MG/ML IJ SOLN
1.0000 mg | Freq: Once | INTRAMUSCULAR | Status: AC
  Administered 2022-03-03: 1 mg via INTRAVENOUS
  Filled 2022-03-03: qty 1

## 2022-03-03 MED ORDER — TAMSULOSIN HCL 0.4 MG PO CAPS: 0.4000 mg | ORAL_CAPSULE | Freq: Every day | ORAL | 0 refills | Status: AC

## 2022-03-03 NOTE — Discharge Instructions (Signed)
You were evaluated in the Emergency Department and after careful evaluation, we did not find any emergent condition requiring admission or further testing in the hospital.  Your exam/testing today is overall reassuring.  Symptoms seem to be due to a kidney stone.  Use the Flomax daily to help pass the stone.  Follow-up with urology.  Recommend Tylenol 1000 mg every 4-6 hours and/or Motrin 600 mg every 4-6 hours for pain.  You can use the oxycodone medication for more significant pain.  Please return to the Emergency Department if you experience any worsening of your condition.   Thank you for allowing [place] to be a part of your care.

## 2022-03-06 ENCOUNTER — Ambulatory Visit (INDEPENDENT_AMBULATORY_CARE_PROVIDER_SITE_OTHER): Payer: 59 | Admitting: Physician Assistant

## 2022-03-06 DIAGNOSIS — Z9889 Other specified postprocedural states: Secondary | ICD-10-CM

## 2022-03-06 NOTE — Progress Notes (Signed)
   Post-Op Visit Note   Patient: Eric Chang           Date of Birth: 1971/04/16           MRN: [id number] Visit Date: 03/06/2022 PCP: Darryll Capers, PA-C   Assessment & Plan:  Chief Complaint:  Chief Complaint  Patient presents with   Right Knee - Routine Post Op   Visit Diagnoses:  1. S/P arthroscopy of right shoulder     Plan: Patient is a pleasant 51 year old gentleman who comes in today approximately 6 weeks status post right knee arthroscopic debridement medial meniscus and chondroplasty.  It was noted during operative invention he had grade 3 changes of medial compartment, grade 2 changes patellofemoral compartment grade 1 changes in the lateral compartment.  He has been doing okay.  He notes occasional discomfort with pivoting but nothing more.  He is taking ibuprofen as needed.  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  Examination of the right knee reveals no effusion.  Range of motion 0 to 120 degrees.  Calf soft nontender.  He is neurovascular intact distally.  At this point, I have agreed to write him out of work for another 2 weeks as there is not a light duty or desk work option.  He thinks he will be able to return at that point.  He will follow-up with [place] as needed.  Call with concerns or questions.  Follow-Up Instructions: Return if symptoms worsen or fail to improve.   Orders:  No orders of the defined types were placed in this encounter.  No orders of the defined types were placed in this encounter.   Imaging: No new imaging  PMFS History: Patient Active Problem List   Diagnosis Date Noted   Acute medial meniscus tear of right knee 01/09/2022   Acute encephalopathy 11/07/2015   Overdose 11/07/2015   Polysubstance abuse (HCC)    Essential hypertension    Tobacco abuse    Past Medical History:  Diagnosis Date   Hypertension    MMT (medial meniscus tear)    right   Polysubstance abuse (HCC)    Smoker    1ppd   Tobacco abuse     UTI (lower urinary tract infection)     No family history on file.  Past Surgical History:  Procedure Laterality Date   BACK SURGERY     x2   KNEE ARTHROSCOPY WITH MENISCAL REPAIR Right 02/06/2022   Procedure: right knee partial medial meniscectomy;  Surgeon: Tarry Kos, MD;  Location: [facility];  Service: Orthopedics;  Laterality: Right;   Social History   Occupational History   Not on file  Tobacco Use   Smoking status: Every Day    Packs/day: 1.00    Years: 30.00    Total pack years: 30.00    Types: Cigarettes   Smokeless tobacco: Former  Building services engineer Use: Never used  Substance and Sexual Activity   Alcohol use: No   Drug use: Not Currently    Types: IV   Sexual activity: Not on file

## 2022-03-19 ENCOUNTER — Encounter: Payer: 59 | Admitting: Orthopaedic Surgery

## 2023-12-12 ENCOUNTER — Emergency Department (HOSPITAL_COMMUNITY)
Admission: EM | Admit: 2023-12-12 | Discharge: 2023-12-13 | Discharge status: Against Medical Advice | Payer: MEDICAID | Attending: Emergency Medicine | Admitting: Emergency Medicine

## 2023-12-12 ENCOUNTER — Other Ambulatory Visit: Payer: Self-pay

## 2023-12-12 ENCOUNTER — Encounter (HOSPITAL_COMMUNITY): Payer: Self-pay

## 2023-12-12 DIAGNOSIS — M542 Cervicalgia: Secondary | ICD-10-CM | POA: Insufficient documentation

## 2023-12-12 DIAGNOSIS — Z5321 Procedure and treatment not carried out due to patient leaving prior to being seen by health care provider: Secondary | ICD-10-CM | POA: Insufficient documentation

## 2023-12-12 DIAGNOSIS — J029 Acute pharyngitis, unspecified: Secondary | ICD-10-CM | POA: Insufficient documentation

## 2023-12-12 LAB — RESP PANEL BY RT-PCR (RSV, FLU A&B, COVID)  RVPGX2
Influenza A by PCR: NEGATIVE
Influenza B by PCR: NEGATIVE
Resp Syncytial Virus by PCR: NEGATIVE
SARS Coronavirus 2 by RT PCR: NEGATIVE

## 2023-12-12 MED ORDER — LIDOCAINE VISCOUS HCL 2 % MT SOLN
15.0000 mL | Freq: Once | OROMUCOSAL | Status: AC
  Administered 2023-12-12: 15 mL via OROMUCOSAL
  Filled 2023-12-12: qty 15

## 2023-12-12 NOTE — ED Provider Triage Note (Signed)
 Emergency Medicine Provider Triage Evaluation Note  Tony Granquist , a 53 y.o. male  was evaluated in triage.  Pt complains of sore throat for the past week.  Patient states he is able to eat and drink but states that hurts to swallow.  Patient denies any fevers, neck pain, facial or neck swelling.  Patient denies sick contacts..  Review of Systems  Positive:  Negative:   Physical Exam  BP (!) 135/90 (BP Location: Right Arm)   Pulse 75   Temp 98.9 F (37.2 C)   Resp 16   Ht 6\' 3"  (1.905 m)   Wt (!) 156.5 kg   SpO2 94%   BMI 43.12 kg/m  Gen:   Awake, no distress   Resp:  Normal effort  MSK:   Moves extremities without difficulty  Other:  Erythema on tonsils however no exudates, no anterior lymphadenopathy, no neck rigidity, tolerating secretions without issue, no muffled voice  Medical Decision Making  Medically screening exam initiated at 9:53 PM.  Appropriate orders placed.  Balthazar Dupras was informed that the remainder of the evaluation will be completed by another provider, this initial triage assessment does not replace that evaluation, and the importance of remaining in the ED until their evaluation is complete.  Workup initiated, patient stable at this time   Elex Grimmer 12/12/23 2154

## 2023-12-12 NOTE — ED Triage Notes (Signed)
 Pt reports sore throat x1 week. No other sxs.

## 2023-12-13 LAB — GROUP A STREP BY PCR: Group A Strep by PCR: NOT DETECTED

## 2023-12-13 NOTE — ED Notes (Signed)
 Pt leaving AMA due to wait
